# Patient Record
Sex: Female | Born: 1970 | Race: White | Hispanic: No | Smoking: Never smoker
Health system: Southern US, Community
[De-identification: ages and names within clinical notes are randomized; demographics above are authoritative.]

## PROBLEM LIST (undated history)

## (undated) DIAGNOSIS — E079 Disorder of thyroid, unspecified: Secondary | ICD-10-CM

## (undated) DIAGNOSIS — K219 Gastro-esophageal reflux disease without esophagitis: Secondary | ICD-10-CM

## (undated) HISTORY — PX: NO PAST SURGERIES: SHX2092

## (undated) HISTORY — DX: Disorder of thyroid, unspecified: E07.9

## (undated) HISTORY — DX: Gastro-esophageal reflux disease without esophagitis: K21.9

---

## 2001-09-21 ENCOUNTER — Other Ambulatory Visit: Admission: RE | Admit: 2001-09-21 | Discharge: 2001-09-21 | Payer: Self-pay | Admitting: Family Medicine

## 2005-07-21 ENCOUNTER — Ambulatory Visit: Payer: Self-pay | Admitting: Family Medicine

## 2006-09-05 IMAGING — NM NM BONE LIMITED
1 series · 4 of 4 positions shown · non-contrast
Comparison: none

REASON FOR EXAM: Scan both feet bilateral foot pain foot xray at [REDACTED] showed bi...
COMMENTS:

[Series 1: bone statics · 2.40mm/px · 2 acquisitions, 4 frames shown]
[im 1/2]
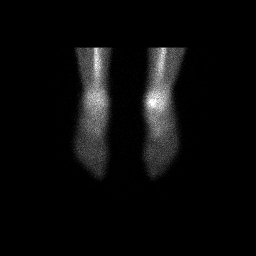
[im 1/2]
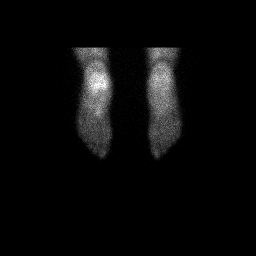
[im 2/2]
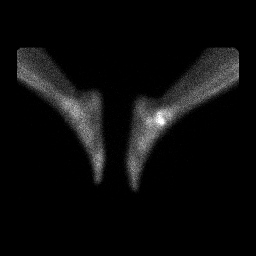
[im 2/2]
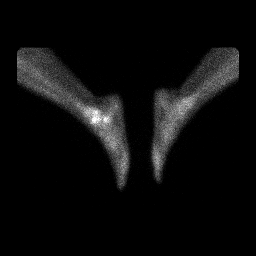

[4 of 4 positions shown; findings below may reference images not displayed]

PROCEDURE:     NM  - NM LIMITED BONE SCAN 3HR [DATE] [DATE]

RESULT:       The patient is complaining of bilateral foot discomfort.

The patient received 23.22 mCi Tc 99m labeled MDP.  Static delayed bone
imaging was performed in the usual fashion.  There is adequate soft tissue
clearance and renal activity present.

There are areas of mildly increased uptake noted involving the LEFT ankle.
A small amount of increased uptake in the anterior aspect of the calcaneus
on the LEFT is suspected as well.  On the plantar views, there is minimally
increased uptake in the region of the second cuneiform on the LEFT but this
is extremely mild.  This may, in fact, be related to the base of the second
metatarsal. Activity over the RIGHT foot appears normal.
IMPRESSION: 1.     Minimally increased uptake in the region of the second cuneiform/base
of the second metatarsal is seen on the LEFT.   Again, this is extremely low
level increased uptake.
2.     There is moderately increased uptake both along the medial and
lateral aspects of the LEFT ankle that suggest degenerative change.

## 2007-08-12 ENCOUNTER — Ambulatory Visit: Payer: Self-pay | Admitting: Gastroenterology

## 2008-02-22 ENCOUNTER — Ambulatory Visit: Payer: Self-pay | Admitting: Unknown Physician Specialty

## 2008-09-26 IMAGING — RF DG UGI W/ KUB
1 series · 15 of 19 positions shown · non-contrast
Comparison: none

REASON FOR EXAM: dysphagia
COMMENTS:

[Series 1: run · 15 of 19 slices shown]
[im 1/19]
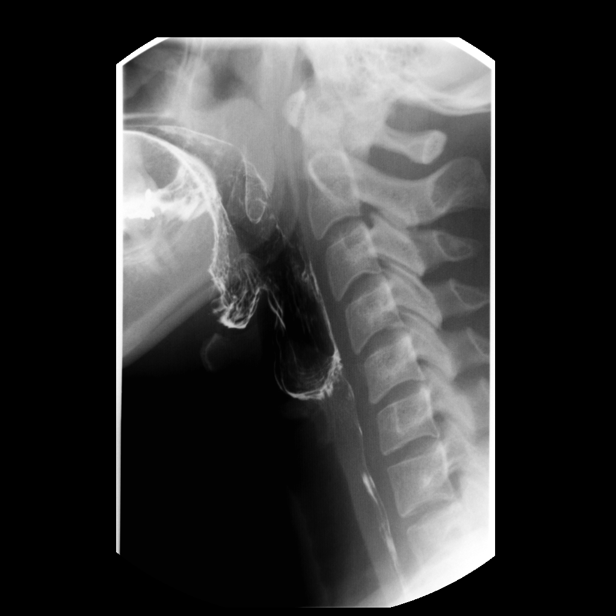
[im 2/19]
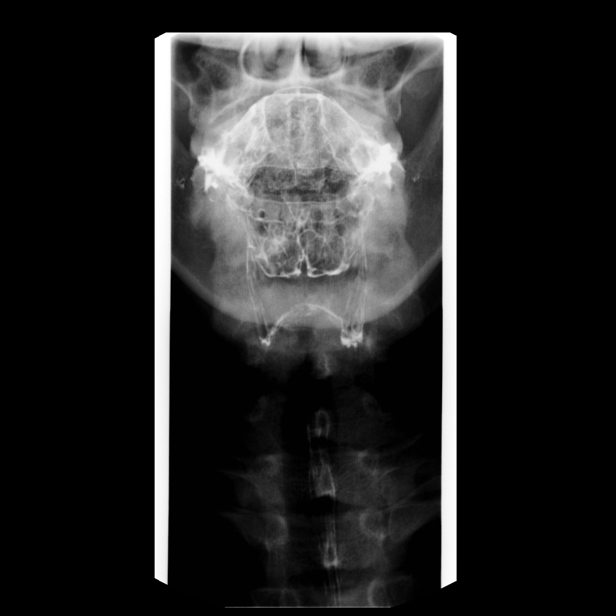
[im 4/19]
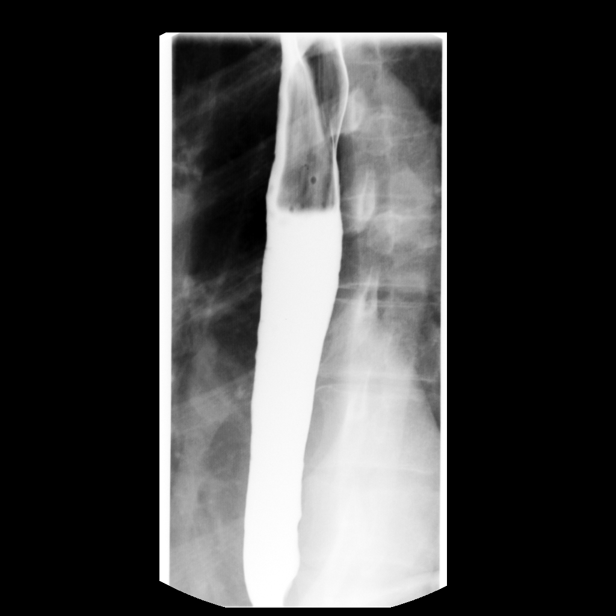
[im 5/19]
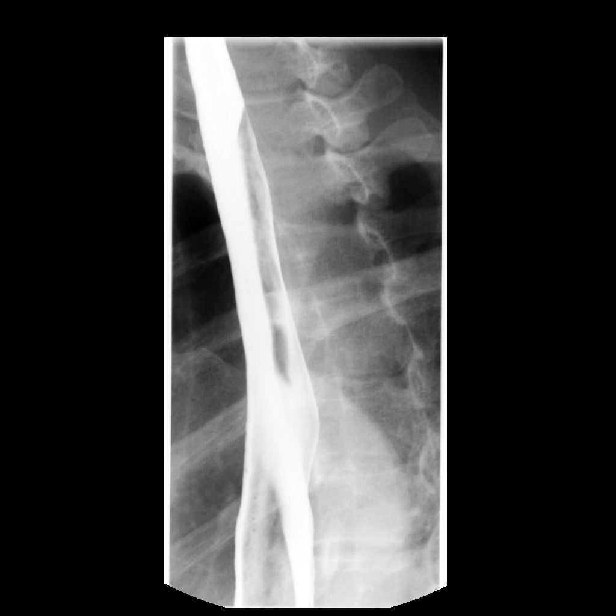
[im 6/19]
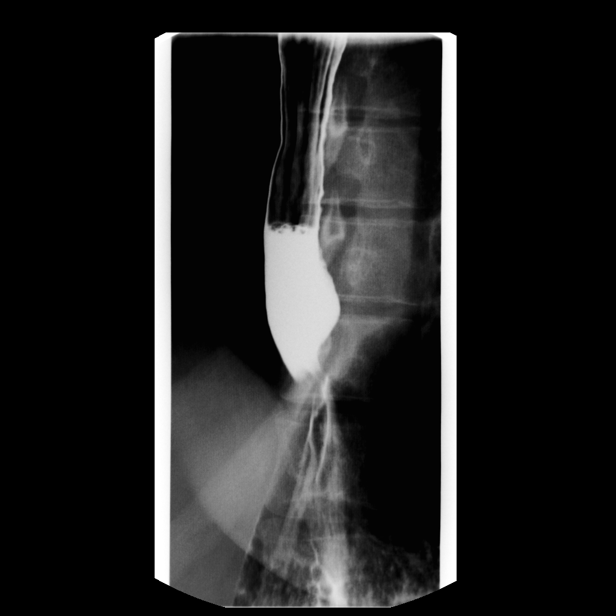
[im 7/19]
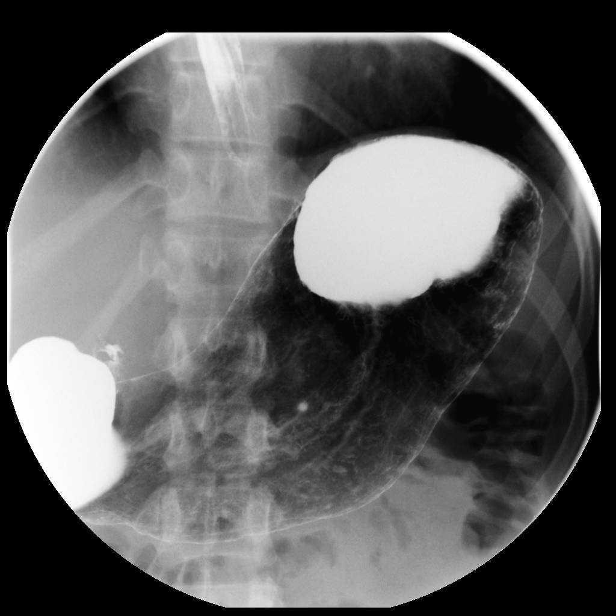
[im 9/19]
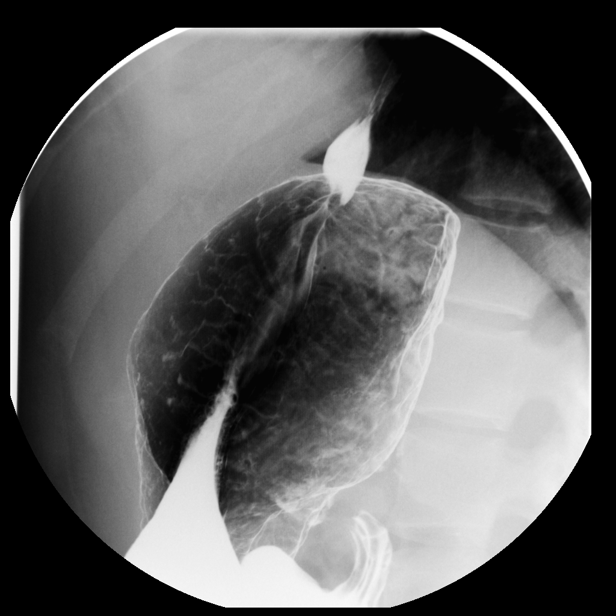
[im 10/19]
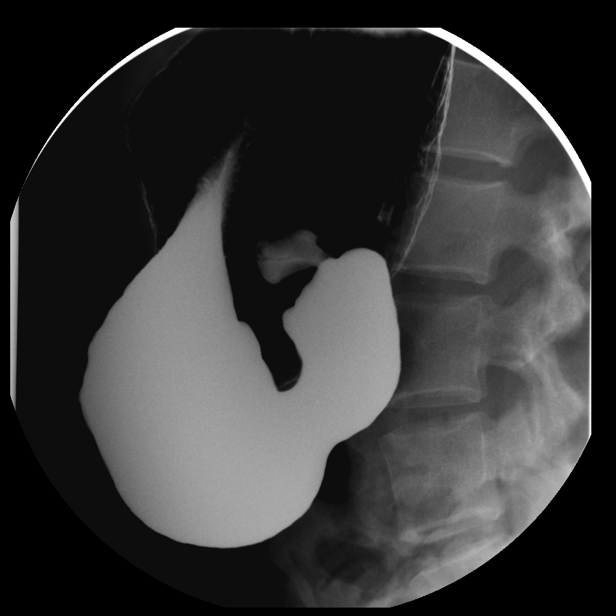
[im 11/19]
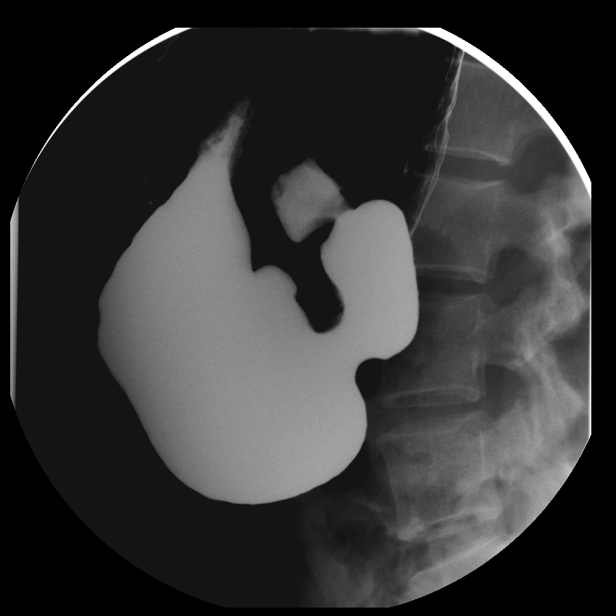
[im 13/19]
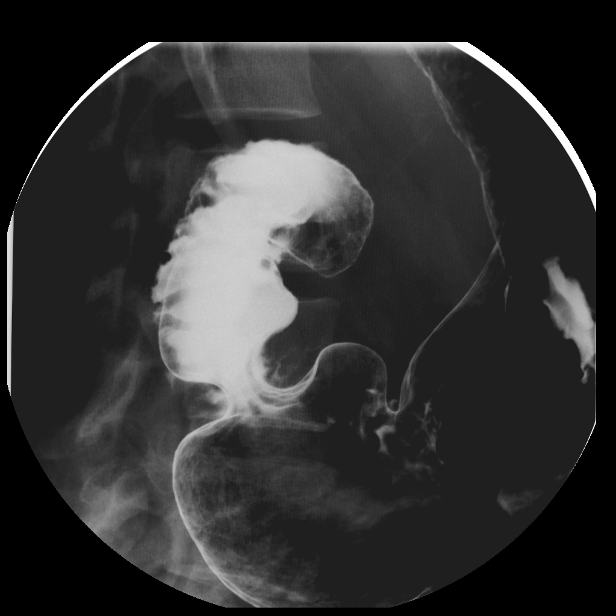
[im 14/19]
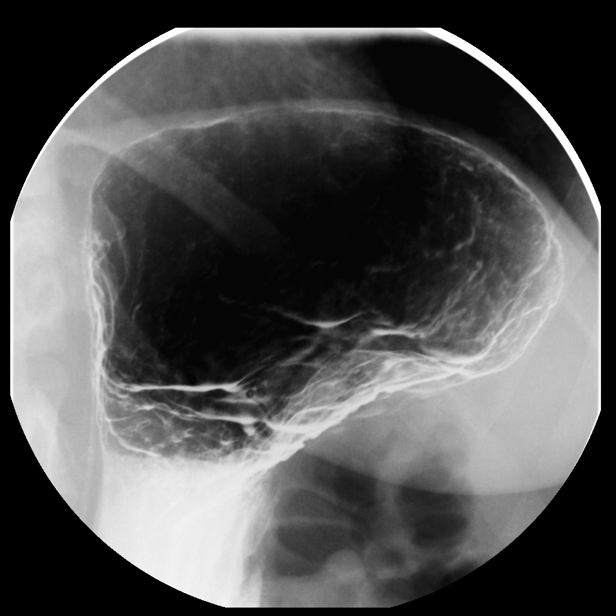
[im 15/19]
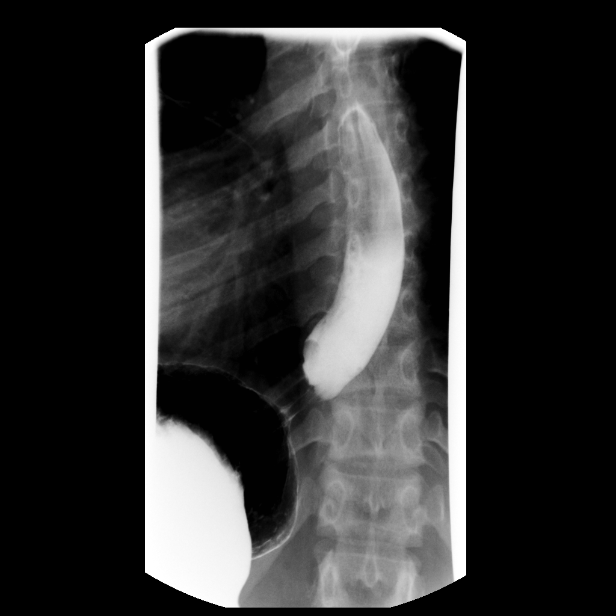
[im 16/19]
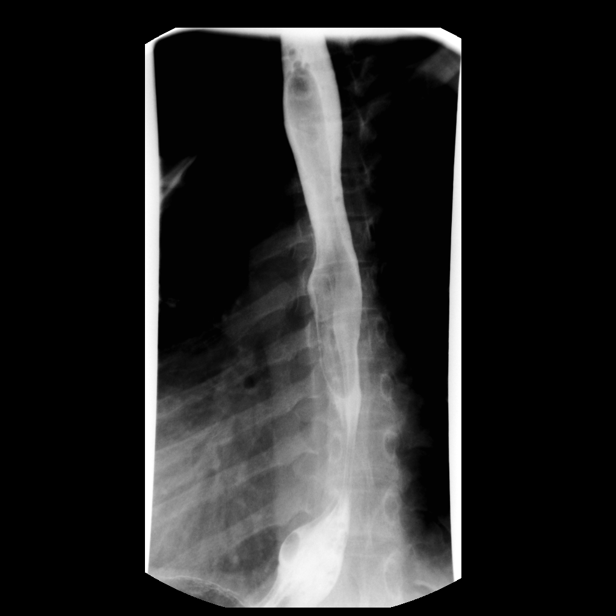
[im 18/19]
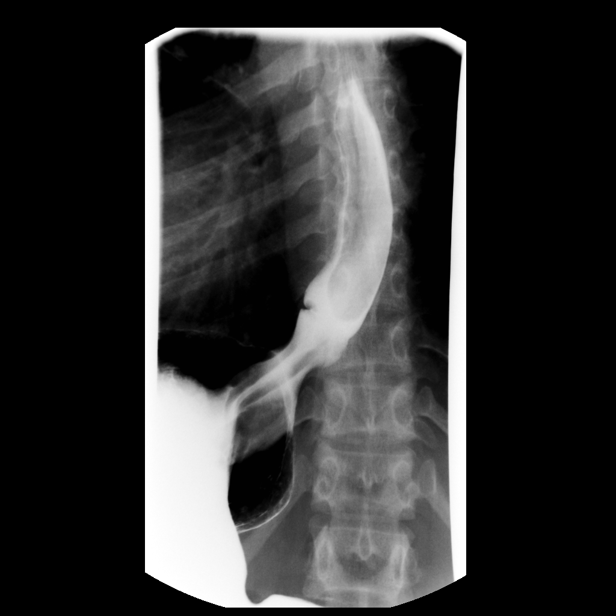
[im 19/19]
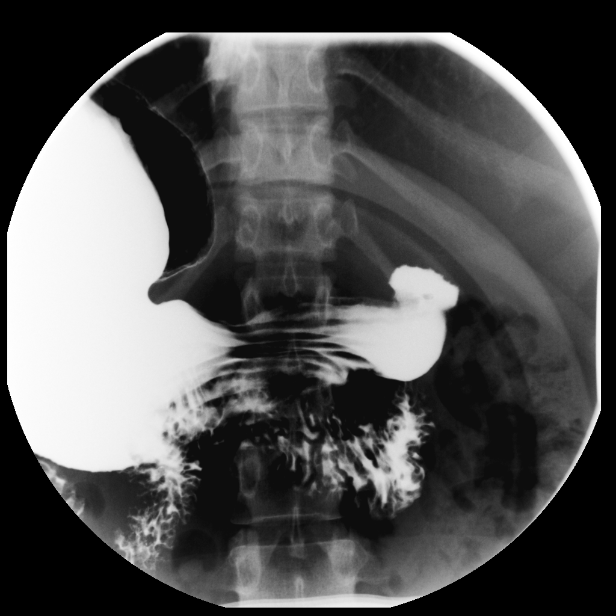

[15 of 19 positions shown; findings below may reference images not displayed]

PROCEDURE:     FL  - FL UPPER GI W/ BARIUM SWALLOW  - August 12, 2007  [DATE]

RESULT:     The anticipated procedure was discussed with Ms. Vaianos. She
voiced her willingness to proceed.

The cervical esophagus distended well. There was no laryngeal penetration of
the barium. The thoracic esophagus also distended well. There is no evidence
of esophagitis. A small amount of spontaneous gastroesophageal reflux was
demonstrated and there was a small reducible hiatal hernia. The 12 mm barium
pill passed without difficulty. The stomach and duodenum are normal in
appearance.
IMPRESSION: There is a small amount of gastroesophageal reflux with a small reducible
hiatal hernia. Otherwise the esophagus and stomach are normal in appearance.

## 2009-08-07 ENCOUNTER — Ambulatory Visit: Payer: Self-pay | Admitting: Unknown Physician Specialty

## 2009-08-28 ENCOUNTER — Ambulatory Visit: Payer: Self-pay | Admitting: Unknown Physician Specialty

## 2010-10-13 IMAGING — US ULTRASOUND LEFT BREAST
1 series · 18 of 25 positions shown · non-contrast
Comparison: none

REASON FOR EXAM: left breast nodular density
COMMENTS:

PROCEDURE:     US  - US BREAST LEFT  - August 28, 2009 [DATE]
RESULT:     A tiny 2-mm cyst is noted in the inferior aspect of the left
breast corresponding to mammographic abnormality. No further evaluation is
needed. Routine yearly follow-up mammogram can be obtained.

[Series 1: ultrasound left breast · 18 of 27 slices shown]
[im 1/27]
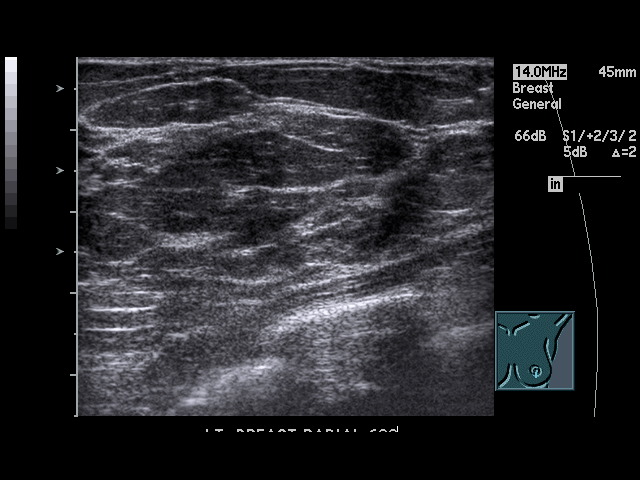
[im 3/27]
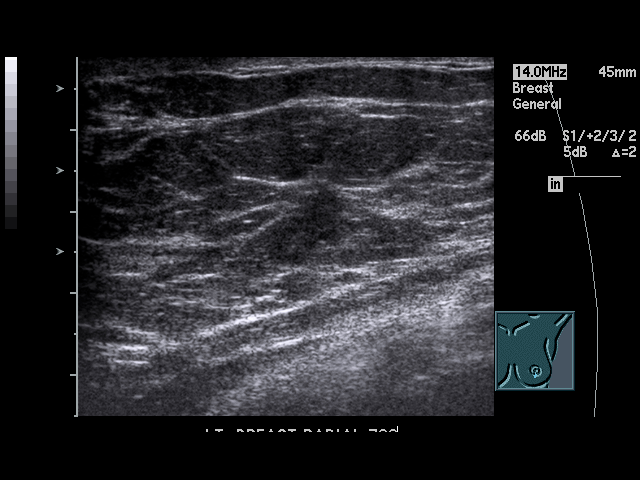
[im 4/27]
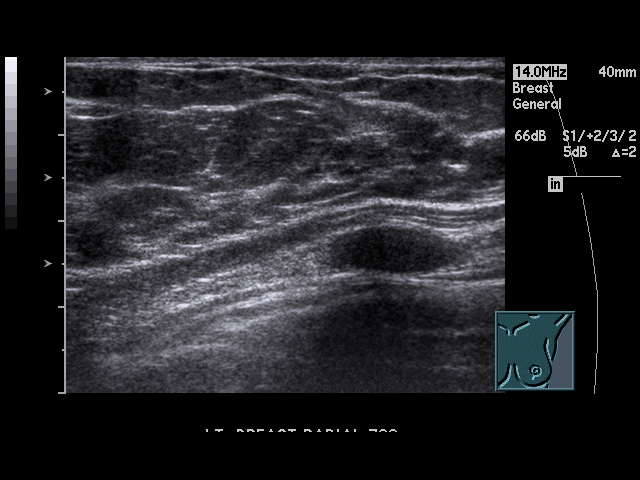
[im 5/27]
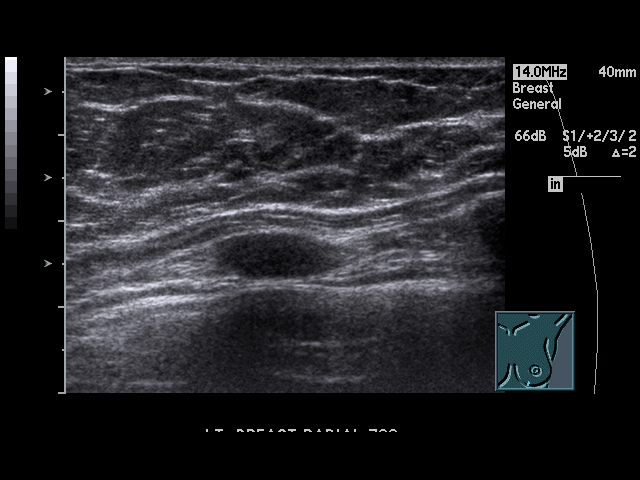
[im 7/27]
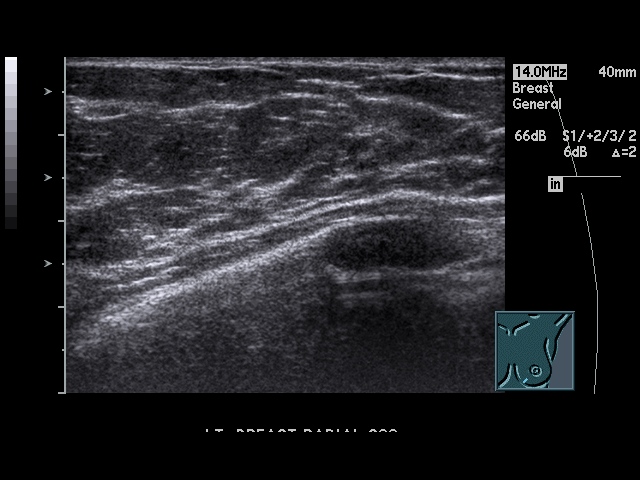
[im 8/27]
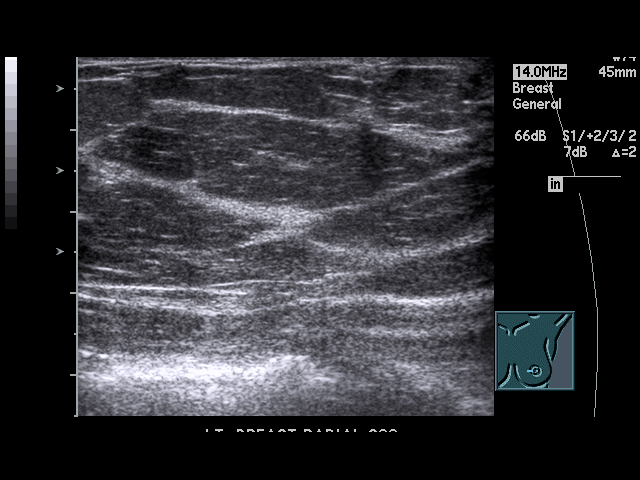
[im 10/27]
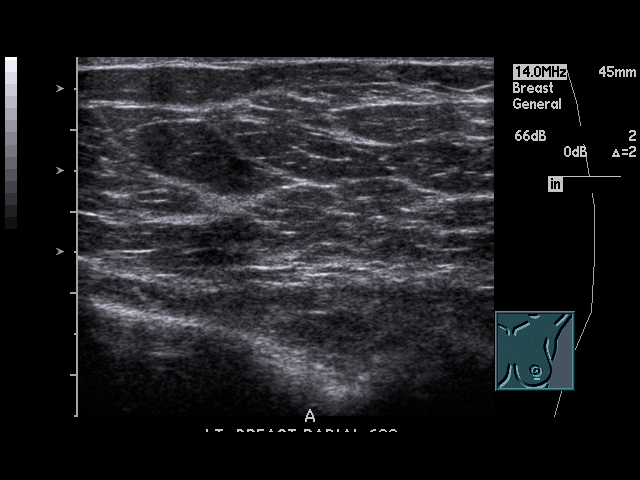
[im 11/27]
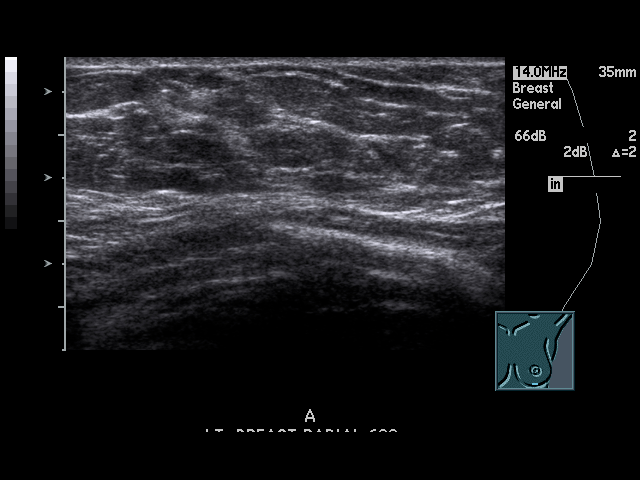
[im 12/27]
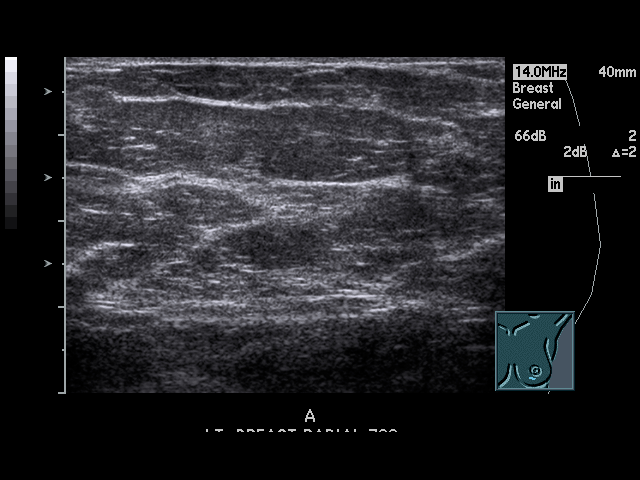
[im 15/27]
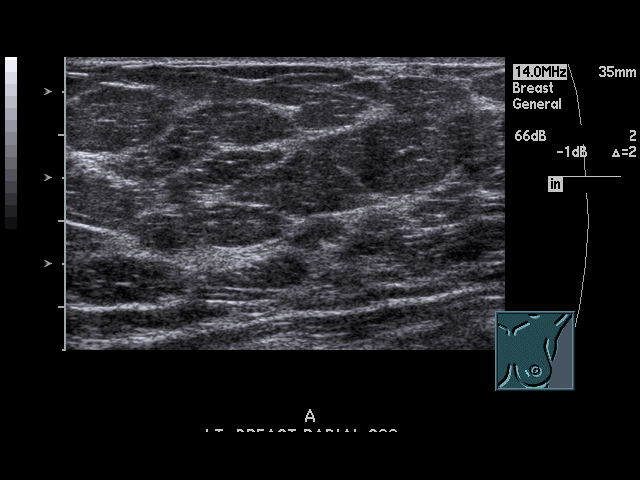
[im 16/27]
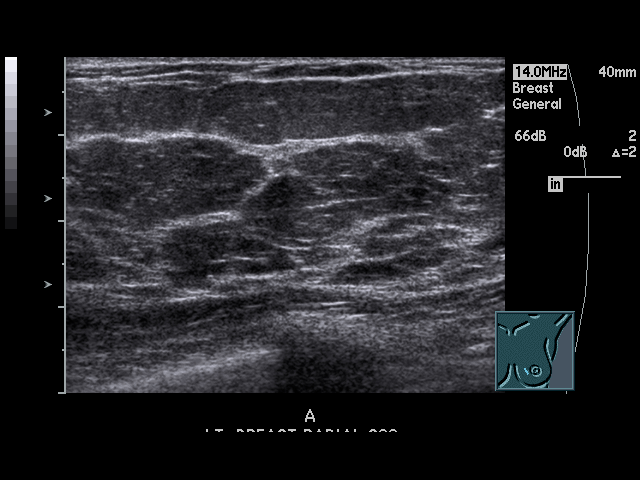
[im 17/27]
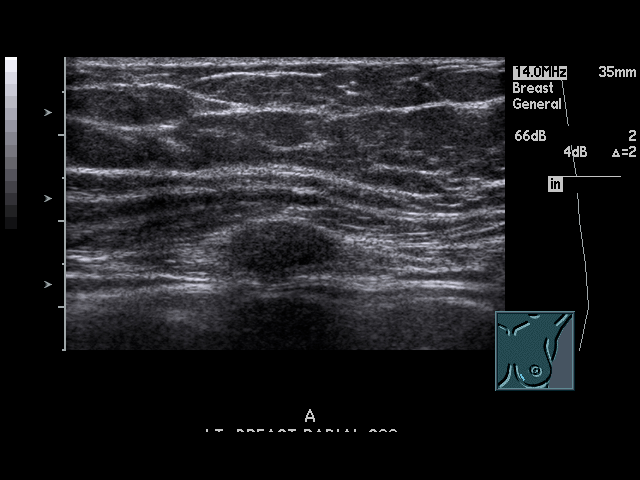
[im 19/27]
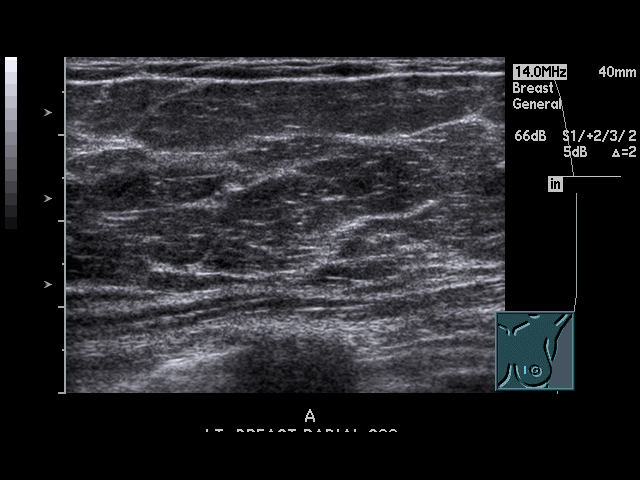
[im 20/27]
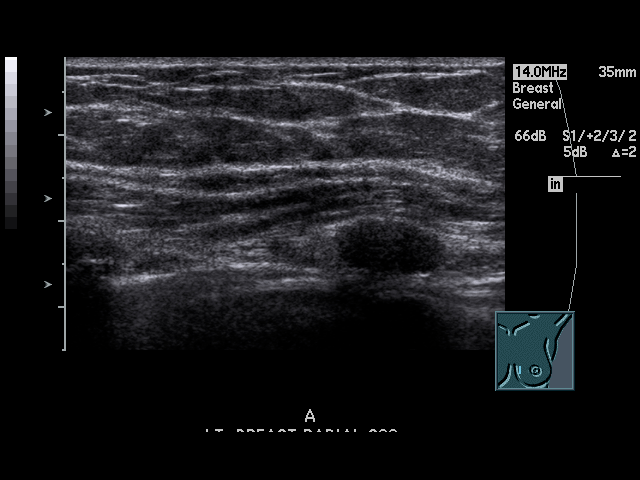
[im 22/27]
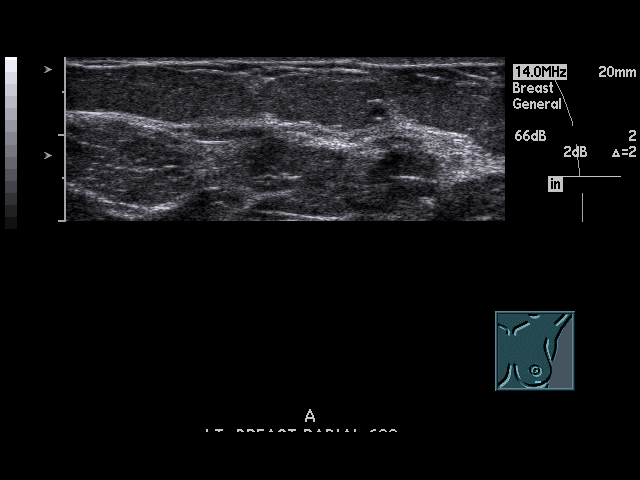
[im 23/27]
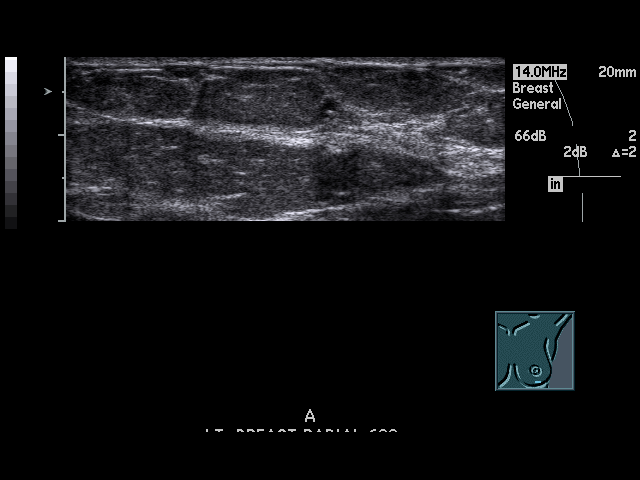
[im 24/27]
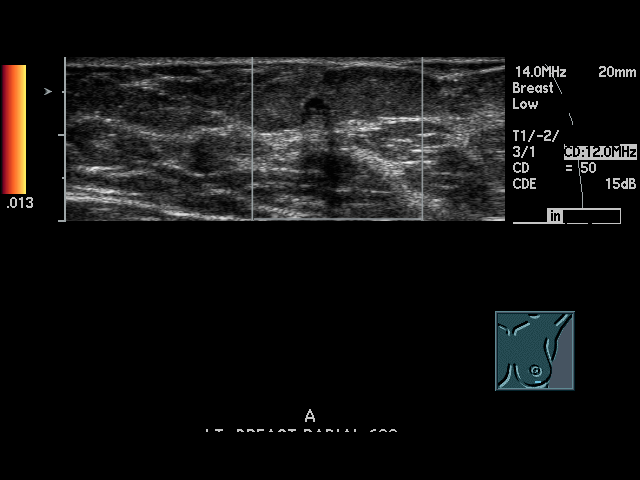
[im 27/27]
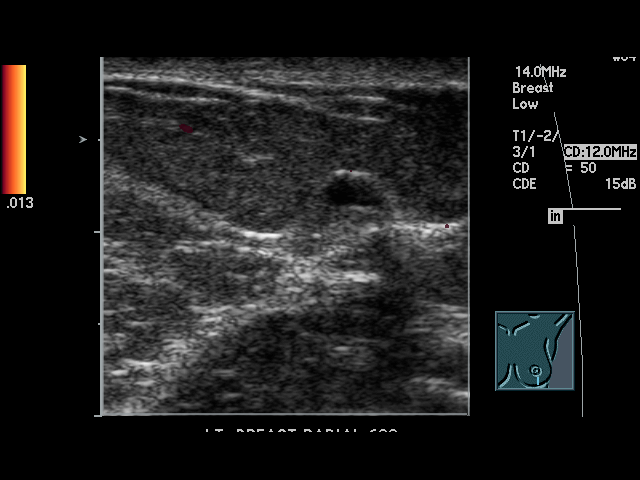

[18 of 25 positions shown; findings below may reference images not displayed]

IMPRESSION: BI-RADS: Category 2 - Benign Findings

## 2015-10-30 ENCOUNTER — Other Ambulatory Visit: Payer: Self-pay | Admitting: Physician Assistant

## 2015-11-01 ENCOUNTER — Other Ambulatory Visit: Payer: Self-pay | Admitting: Emergency Medicine

## 2015-11-01 MED ORDER — OMEPRAZOLE 20 MG PO CPDR: 20.0000 mg | DELAYED_RELEASE_CAPSULE | Freq: Every day | ORAL | Status: DC

## 2015-11-01 NOTE — Telephone Encounter (Signed)
Med refill approved 

## 2015-11-01 NOTE — Telephone Encounter (Signed)
Received a faxed medication request from [facility] [address]  Please advise.  Thank you.

## 2016-04-08 ENCOUNTER — Encounter: Payer: Self-pay | Admitting: Physician Assistant

## 2016-04-08 ENCOUNTER — Ambulatory Visit: Payer: Self-pay | Admitting: Physician Assistant

## 2016-04-08 VITALS — BP 120/70 | HR 79 | Temp 98.7°F

## 2016-04-08 DIAGNOSIS — B353 Tinea pedis: Secondary | ICD-10-CM

## 2016-04-08 DIAGNOSIS — B351 Tinea unguium: Secondary | ICD-10-CM

## 2016-04-08 MED ORDER — TERBINAFINE HCL 250 MG PO TABS: 250.0000 mg | ORAL_TABLET | Freq: Every day | ORAL | Status: DC

## 2016-04-08 NOTE — Progress Notes (Signed)
S: c/o foot pain and rash on toes, on going problems with athletes foot, tinactin has stopped working, area will get too dry and crack, then looks infected and will clear up, ? If can't feel her feet really well because didn't realize her feet were soaked with water while out on a call  O: vitals wnl, nad, skin on feet dry scaly flakey and cracked, great toenail discolored by yellow tint on 1/3 of nail, n/v intact  A: foot pain, tinea pedis, onychomycosis of toenail

## 2016-04-09 LAB — COMPREHENSIVE METABOLIC PANEL
A/G RATIO: 1.7 (ref 1.2–2.2)
ALT: 33 IU/L — AB (ref 0–32)
AST: 25 IU/L (ref 0–40)
Albumin: 4.7 g/dL (ref 3.5–5.5)
Alkaline Phosphatase: 50 IU/L (ref 39–117)
BILIRUBIN TOTAL: 0.2 mg/dL (ref 0.0–1.2)
BUN/Creatinine Ratio: 18 (ref 9–23)
BUN: 14 mg/dL (ref 6–24)
CALCIUM: 9.9 mg/dL (ref 8.7–10.2)
CHLORIDE: 98 mmol/L (ref 96–106)
CO2: 21 mmol/L (ref 18–29)
Creatinine, Ser: 0.77 mg/dL (ref 0.57–1.00)
GFR calc Af Amer: 109 mL/min/{1.73_m2} (ref >59)
GFR calc non Af Amer: 95 mL/min/{1.73_m2} (ref >59)
GLUCOSE: 110 mg/dL — AB (ref 65–99)
Globulin, Total: 2.7 g/dL (ref 1.5–4.5)
POTASSIUM: 4.1 mmol/L (ref 3.5–5.2)
Sodium: 138 mmol/L (ref 134–144)
Total Protein: 7.4 g/dL (ref 6.0–8.5)

## 2016-05-05 ENCOUNTER — Telehealth: Payer: Self-pay | Admitting: Family Medicine

## 2016-05-05 NOTE — Telephone Encounter (Signed)
Please arrange and needed.  Thanks ED

## 2016-05-05 NOTE — Telephone Encounter (Signed)
ok 

## 2016-05-05 NOTE — Telephone Encounter (Signed)
Please review-aa 

## 2016-05-05 NOTE — Telephone Encounter (Signed)
Pt would like to re-establish with Dr. Sullivan LoneGilbert. Pt was last seen 02/07/11. Pt stated that she hasn't been to another PCP just going to Urgent Cares as needed.  Insurance: [employer] Current Medications: Omeprazole Pt stated no new medical conditions but would like to discuss elevated BP. Pt stated that she doesn't mind waiting to get an appt but she would really like to re-establish care with Dr. Sullivan LoneGilbert. Please advise. Thanks TNP

## 2016-05-05 NOTE — Telephone Encounter (Signed)
Scheduled pt for Wednesday 05/07/16 @ 10:30. Thanks TNP

## 2016-05-07 ENCOUNTER — Ambulatory Visit (INDEPENDENT_AMBULATORY_CARE_PROVIDER_SITE_OTHER): Payer: Managed Care, Other (non HMO) | Admitting: Family Medicine

## 2016-05-07 ENCOUNTER — Encounter: Payer: Self-pay | Admitting: Family Medicine

## 2016-05-07 VITALS — BP 110/80 | HR 64 | Temp 97.9°F | Resp 16 | Ht 64.0 in | Wt 179.0 lb

## 2016-05-07 DIAGNOSIS — R0789 Other chest pain: Secondary | ICD-10-CM

## 2016-05-07 DIAGNOSIS — R030 Elevated blood-pressure reading, without diagnosis of hypertension: Secondary | ICD-10-CM

## 2016-05-07 DIAGNOSIS — R5383 Other fatigue: Secondary | ICD-10-CM

## 2016-05-07 DIAGNOSIS — E785 Hyperlipidemia, unspecified: Secondary | ICD-10-CM

## 2016-05-07 DIAGNOSIS — Z7189 Other specified counseling: Secondary | ICD-10-CM | POA: Diagnosis not present

## 2016-05-07 DIAGNOSIS — IMO0001 Reserved for inherently not codable concepts without codable children: Secondary | ICD-10-CM

## 2016-05-07 DIAGNOSIS — K219 Gastro-esophageal reflux disease without esophagitis: Secondary | ICD-10-CM

## 2016-05-07 DIAGNOSIS — Z7689 Persons encountering health services in other specified circumstances: Secondary | ICD-10-CM

## 2016-05-07 MED ORDER — PANTOPRAZOLE SODIUM 40 MG PO TBEC: 40.0000 mg | DELAYED_RELEASE_TABLET | Freq: Every day | ORAL | Status: DC

## 2016-05-07 NOTE — Progress Notes (Signed)
Patient ID: Eileen Hooper, female   DOB: 10/31/1971, 45 y.o.   MRN: [id number]    Subjective:  HPI Pt is re-establishing care today. [facility]. She has been having increased GERD. She one episode where she had been drinking the night before (memorial day weekend) she got out of bed feeling very tired and run down. She got a different kind of coffee from a different coffee shop with an extra shot of caffeine. She late started feeling increased chest tightness, headache and shortness of breath. Her supervisor took her to EMS station and she was examined, they did a EKG and her BP was 160/100. She was told to follow up with her PCP. She reports that she has burping and reflux accompanied with chest tightness in the center of the chest. She reports that she can not get comfortable at night. She says its severe and she can not eat anything without having this. She has been taking Omeprazole 20 mg for about 3 years. She was having these type of symptoms when she was first prescribed Omeprazole.   Prior to Admission medications   Medication Sig Start Date End Date Taking? Authorizing Provider  omeprazole (PRILOSEC) 20 MG capsule Take 1 capsule (20 mg total) by mouth daily. 11/01/15  Yes Faythe GheeSusan W Fisher, PA-C  terbinafine (LAMISIL) 250 MG tablet Take 1 tablet (250 mg total) by mouth daily. 04/08/16  Yes Faythe GheeSusan W Fisher, PA-C    Patient Active Problem List   Diagnosis Date Noted  . GERD (gastroesophageal reflux disease) 05/07/2016    History reviewed. No pertinent past medical history.  Social History   Social History  . Marital Status: Married    Spouse Name: N/A  . Number of Children: N/A  . Years of Education: N/A   Occupational History  . Not on file.   Social History Main Topics  . Smoking status: Never Smoker   . Smokeless tobacco: Not on file  . Alcohol Use: 0.0 oz/week    0 Standard drinks or equivalent per week     Comment: socially  . Drug  Use: No  . Sexual Activity: Not on file   Other Topics Concern  . Not on file   Social History Narrative    No Known Allergies  Review of Systems  Constitutional: Positive for malaise/fatigue.  Eyes: Negative.   Respiratory: Positive for shortness of breath.   Cardiovascular: Positive for chest pain.  Gastrointestinal: Positive for heartburn.  Genitourinary: Negative.   Musculoskeletal: Negative.   Skin: Negative.   Neurological: Negative.   Endo/Heme/Allergies: Negative.   Psychiatric/Behavioral: The patient is nervous/anxious.        Agitation and irritability     There is no immunization history on file for this patient. Objective:  BP 110/80 mmHg  Pulse 64  Temp(Src) 97.9 F (36.6 C) (Oral)  Resp 16  Ht 5\' 4"  (1.626 m)  Wt 179 lb (81.194 kg)  BMI 30.71 kg/m2  Physical Exam  Constitutional: She is oriented to person, place, and time and well-developed, well-nourished, and in no distress.  HENT:  Head: Normocephalic and atraumatic.  Right Ear: External ear normal.  Left Ear: External ear normal.  Nose: Nose normal.  Eyes: Conjunctivae and EOM are normal. Pupils are equal, round, and reactive to light.  Neck: Normal range of motion. Neck supple.  Cardiovascular: Normal rate, regular rhythm, normal heart sounds and intact distal pulses.   Pulmonary/Chest: Effort normal and breath sounds  normal.  Abdominal: Soft. Bowel sounds are normal.  Musculoskeletal: Normal range of motion.  Neurological: She is alert and oriented to person, place, and time. She has normal reflexes. Gait normal. GCS score is 15.  Skin: Skin is warm and dry.  Psychiatric: Mood, memory, affect and judgment normal.    Lab Results  Component Value Date   GLUCOSE 110* 04/08/2016    CMP     Component Value Date/Time   NA 138 04/08/2016 1619   K 4.1 04/08/2016 1619   CL 98 04/08/2016 1619   CO2 21 04/08/2016 1619   GLUCOSE 110* 04/08/2016 1619   BUN 14 04/08/2016 1619   CREATININE  0.77 04/08/2016 1619   CALCIUM 9.9 04/08/2016 1619   PROT 7.4 04/08/2016 1619   ALBUMIN 4.7 04/08/2016 1619   AST 25 04/08/2016 1619   ALT 33* 04/08/2016 1619   ALKPHOS 50 04/08/2016 1619   BILITOT 0.2 04/08/2016 1619   GFRNONAA 95 04/08/2016 1619   GFRAA 109 04/08/2016 1619    Assessment and Plan :  1. Gastroesophageal reflux disease, esophagitis presence not specified RTC 1 month. - Comprehensive metabolic panel - pantoprazole (PROTONIX) 40 MG tablet; Take 1 tablet (40 mg total) by mouth daily.  Dispense: 60 tablet; Refill: 12  2. Encounter to establish care   3. Other fatigue  - CBC with Differential/Platelet - TSH  4. Chest tightness ECG from work is unremarkable. Deconditioning. 5. Hyperlipemia  - Lipid Panel With LDL/HDL Ratio  6. Elevated BP 7. Weight gain Patient says she has gained 50 pounds since 2011 with overeating and no exercise.  Patient was seen and examined by Dr. Julieanne Manson, and noted scribed by Dimas Chyle, CMA  Julieanne Manson MD [facility] 05/07/2016 10:50 AM

## 2016-05-13 LAB — CBC WITH DIFFERENTIAL/PLATELET
BASOS: 0 %
Basophils Absolute: 0 10*3/uL (ref 0.0–0.2)
EOS (ABSOLUTE): 0.2 10*3/uL (ref 0.0–0.4)
Eos: 3 %
HEMATOCRIT: 41.6 % (ref 34.0–46.6)
HEMOGLOBIN: 13.6 g/dL (ref 11.1–15.9)
IMMATURE GRANS (ABS): 0 10*3/uL (ref 0.0–0.1)
Immature Granulocytes: 0 %
LYMPHS: 30 %
Lymphocytes Absolute: 2.1 10*3/uL (ref 0.7–3.1)
MCH: 28.4 pg (ref 26.6–33.0)
MCHC: 32.7 g/dL (ref 31.5–35.7)
MCV: 87 fL (ref 79–97)
MONOCYTES: 6 %
Monocytes Absolute: 0.4 10*3/uL (ref 0.1–0.9)
NEUTROS ABS: 4.3 10*3/uL (ref 1.4–7.0)
Neutrophils: 61 %
Platelets: 258 10*3/uL (ref 150–379)
RBC: 4.79 x10E6/uL (ref 3.77–5.28)
RDW: 14.6 % (ref 12.3–15.4)
WBC: 7.1 10*3/uL (ref 3.4–10.8)

## 2016-05-13 LAB — COMPREHENSIVE METABOLIC PANEL
A/G RATIO: 1.4 (ref 1.2–2.2)
ALBUMIN: 4.4 g/dL (ref 3.5–5.5)
ALT: 36 IU/L — ABNORMAL HIGH (ref 0–32)
AST: 23 IU/L (ref 0–40)
Alkaline Phosphatase: 55 IU/L (ref 39–117)
BILIRUBIN TOTAL: 0.4 mg/dL (ref 0.0–1.2)
BUN / CREAT RATIO: 12 (ref 9–23)
BUN: 10 mg/dL (ref 6–24)
CHLORIDE: 100 mmol/L (ref 96–106)
CO2: 19 mmol/L (ref 18–29)
Calcium: 9.4 mg/dL (ref 8.7–10.2)
Creatinine, Ser: 0.84 mg/dL (ref 0.57–1.00)
GFR, EST AFRICAN AMERICAN: 98 mL/min/{1.73_m2} (ref >59)
GFR, EST NON AFRICAN AMERICAN: 85 mL/min/{1.73_m2} (ref >59)
Globulin, Total: 3.1 g/dL (ref 1.5–4.5)
Glucose: 108 mg/dL — ABNORMAL HIGH (ref 65–99)
POTASSIUM: 4.7 mmol/L (ref 3.5–5.2)
Sodium: 137 mmol/L (ref 134–144)
TOTAL PROTEIN: 7.5 g/dL (ref 6.0–8.5)

## 2016-05-13 LAB — LIPID PANEL WITH LDL/HDL RATIO
Cholesterol, Total: 195 mg/dL (ref 100–199)
HDL: 45 mg/dL (ref >39)
LDL CALC: 133 mg/dL — AB (ref 0–99)
LDL/HDL RATIO: 3 ratio (ref 0.0–3.2)
Triglycerides: 87 mg/dL (ref 0–149)
VLDL Cholesterol Cal: 17 mg/dL (ref 5–40)

## 2016-05-13 LAB — TSH: TSH: 6.8 u[IU]/mL — ABNORMAL HIGH (ref 0.450–4.500)

## 2016-05-14 ENCOUNTER — Other Ambulatory Visit: Payer: Self-pay

## 2016-05-14 DIAGNOSIS — E038 Other specified hypothyroidism: Secondary | ICD-10-CM

## 2016-05-14 DIAGNOSIS — E039 Hypothyroidism, unspecified: Secondary | ICD-10-CM | POA: Insufficient documentation

## 2016-05-14 DIAGNOSIS — E669 Obesity, unspecified: Secondary | ICD-10-CM | POA: Insufficient documentation

## 2016-05-14 MED ORDER — LEVOTHYROXINE SODIUM 50 MCG PO TABS: 50.0000 ug | ORAL_TABLET | Freq: Every day | ORAL | Status: DC

## 2016-06-09 ENCOUNTER — Encounter: Payer: Self-pay | Admitting: Family Medicine

## 2016-06-09 ENCOUNTER — Ambulatory Visit (INDEPENDENT_AMBULATORY_CARE_PROVIDER_SITE_OTHER): Payer: Managed Care, Other (non HMO) | Admitting: Family Medicine

## 2016-06-09 VITALS — BP 112/76 | HR 70 | Temp 97.6°F | Resp 16 | Wt 180.0 lb

## 2016-06-09 DIAGNOSIS — K219 Gastro-esophageal reflux disease without esophagitis: Secondary | ICD-10-CM | POA: Diagnosis not present

## 2016-06-09 DIAGNOSIS — E038 Other specified hypothyroidism: Secondary | ICD-10-CM

## 2016-06-09 NOTE — Progress Notes (Signed)
Patient ID: Eileen Hooper, female   DOB: 01/13/1971, 45 y.o.   MRN: [id number]    Subjective:  HPI GERD- Pt is here for a 1 month follow up for GERD. She was started on Pantoprazole at last OV. She reports that she feels much better. She is only taking it once a day instead of twice because she has felt so good. Labs were drawn at last OV and showed her to have hypothyroidism. She has started Synthroid and she reports that she feels great. Notes on the lab results say to recheck TSH in 3-4 months.   Prior to Admission medications   Medication Sig Start Date End Date Taking? Authorizing Provider  levothyroxine (SYNTHROID, LEVOTHROID) 50 MCG tablet Take 1 tablet (50 mcg total) by mouth daily. 05/14/16  Yes Princessa Lesmeister Hulen ShoutsL Ormand Senn Jr., MD  pantoprazole (PROTONIX) 40 MG tablet Take 1 tablet (40 mg total) by mouth daily. 05/07/16  Yes Modestine Scherzinger Hulen ShoutsL Phyllicia Dudek Jr., MD    Patient Active Problem List   Diagnosis Date Noted  . Hypothyroidism 05/14/2016  . Adiposity 05/14/2016  . GERD (gastroesophageal reflux disease) 05/07/2016    History reviewed. No pertinent past medical history.  Social History   Social History  . Marital Status: Married    Spouse Name: N/A  . Number of Children: N/A  . Years of Education: N/A   Occupational History  . Not on file.   Social History Main Topics  . Smoking status: Never Smoker   . Smokeless tobacco: Not on file  . Alcohol Use: 0.0 oz/week    0 Standard drinks or equivalent per week     Comment: socially  . Drug Use: No  . Sexual Activity: Not on file   Other Topics Concern  . Not on file   Social History Narrative    No Known Allergies  Review of Systems  Constitutional: Negative.   HENT: Negative.   Eyes: Negative.   Respiratory: Negative.   Cardiovascular: Negative.   Gastrointestinal: Negative.   Genitourinary: Negative.   Musculoskeletal: Negative.   Skin: Negative.   Neurological: Negative.   Endo/Heme/Allergies: Negative.     Psychiatric/Behavioral: Negative.      There is no immunization history on file for this patient. Objective:  BP 112/76 mmHg  Pulse 70  Temp(Src) 97.6 F (36.4 C) (Oral)  Resp 16  Wt 180 lb (81.647 kg)  Physical Exam  Constitutional: She is oriented to person, place, and time and well-developed, well-nourished, and in no distress.  Eyes: Conjunctivae and EOM are normal. Pupils are equal, round, and reactive to light.  Neck: Normal range of motion. Neck supple.  Cardiovascular: Normal rate, regular rhythm, normal heart sounds and intact distal pulses.   Pulmonary/Chest: Effort normal and breath sounds normal.  Abdominal: Soft. Bowel sounds are normal.  Musculoskeletal: Normal range of motion.  Neurological: She is alert and oriented to person, place, and time. She has normal reflexes. Gait normal. GCS score is 15.  Skin: Skin is warm and dry.  Psychiatric: Mood, memory, affect and judgment normal.    Lab Results  Component Value Date   WBC 7.1 05/12/2016   HCT 41.6 05/12/2016   PLT 258 05/12/2016   GLUCOSE 108* 05/12/2016   CHOL 195 05/12/2016   TRIG 87 05/12/2016   HDL 45 05/12/2016   LDLCALC 133* 05/12/2016   TSH 6.800* 05/12/2016    CMP     Component Value Date/Time   NA 137 05/12/2016 0816   K 4.7 05/12/2016 0816  CL 100 05/12/2016 0816   CO2 19 05/12/2016 0816   GLUCOSE 108* 05/12/2016 0816   BUN 10 05/12/2016 0816   CREATININE 0.84 05/12/2016 0816   CALCIUM 9.4 05/12/2016 0816   PROT 7.5 05/12/2016 0816   ALBUMIN 4.4 05/12/2016 0816   AST 23 05/12/2016 0816   ALT 36* 05/12/2016 0816   ALKPHOS 55 05/12/2016 0816   BILITOT 0.4 05/12/2016 0816   GFRNONAA 85 05/12/2016 0816   GFRAA 98 05/12/2016 0816    Assessment and Plan :  1. Gastroesophageal reflux disease, esophagitis presence not specified Much improved. Continue Pantoprazole once daily. At next visit may try to wean off pantoprazole.   2. Other specified hypothyroidism Pt feeling much  better. Check labs at next OV.  3. Mild obesity Diet and exercise habits discussed.  Patient was seen and examined by Dr. Julieanne Manson, and noted scribed by Dimas Chyle, CMA  Julieanne Manson MD [facility] 06/09/2016 11:29 AM

## 2016-08-18 ENCOUNTER — Ambulatory Visit (INDEPENDENT_AMBULATORY_CARE_PROVIDER_SITE_OTHER): Payer: Managed Care, Other (non HMO) | Admitting: Family Medicine

## 2016-08-18 DIAGNOSIS — K219 Gastro-esophageal reflux disease without esophagitis: Secondary | ICD-10-CM

## 2016-08-18 DIAGNOSIS — E038 Other specified hypothyroidism: Secondary | ICD-10-CM | POA: Diagnosis not present

## 2016-08-18 NOTE — Progress Notes (Signed)
Subjective:  HPI Hypothyroidism: Patient presents for evaluation of thyroid function. Symptoms consist of denies fatigue, weight changes, heat/cold intolerance, bowel/skin changes or CVS symptoms. The problem has been unchanged.  Previous thyroid studies include TSH. Pt was seen 23-Jun-2016 for her GERD and thyroid. Last labs were done on 05/12/16 and her TSH was elevated and to recheck in 3-4 months. Pt denies any side effects however she has noticed that she feels like the thyriod medication has changed her taste and made her not want any sweets. Energy level has improved and pt is doing well. She is due to recheck her TSH today.   GERD: Pt reports that she is still taking pantoprazole once daily. She tried to stop it completely but she could not tolerate not taking.    Prior to Admission medications   Medication Sig Start Date End Date Taking? Authorizing Provider  levothyroxine (SYNTHROID, LEVOTHROID) 50 MCG tablet Take 1 tablet (50 mcg total) by mouth daily. 05/14/16   Richard Hulen Shouts., MD  pantoprazole (PROTONIX) 40 MG tablet Take 1 tablet (40 mg total) by mouth daily. 05/07/16   Richard Hulen Shouts., MD    Patient Active Problem List   Diagnosis Date Noted  . Hypothyroidism 05/14/2016  . Adiposity 05/14/2016  . GERD (gastroesophageal reflux disease) 05/07/2016    No past medical history on file.  Social History   Social History  . Marital status: Married    Spouse name: N/A  . Number of children: N/A  . Years of education: N/A   Occupational History  . Not on file.   Social History Main Topics  . Smoking status: Never Smoker  . Smokeless tobacco: Not on file  . Alcohol use 0.0 oz/week     Comment: socially  . Drug use: No  . Sexual activity: Not on file   Other Topics Concern  . Not on file   Social History Narrative  . No narrative on file    No Known Allergies  Review of Systems  Constitutional: Negative.   HENT: Negative.   Eyes: Negative.     Respiratory: Negative.   Cardiovascular: Negative.   Gastrointestinal: Negative.   Genitourinary: Negative.   Musculoskeletal: Negative.   Skin: Negative.   Neurological: Negative.   Endo/Heme/Allergies: Negative.   Psychiatric/Behavioral: Negative.      There is no immunization history on file for this patient. Objective:  There were no vitals taken for this visit.  Physical Exam  Constitutional: She is oriented to person, place, and time and well-developed, well-nourished, and in no distress.  HENT:  Head: Normocephalic and atraumatic.  Right Ear: External ear normal.  Left Ear: External ear normal.  Nose: Nose normal.  Eyes: Conjunctivae and EOM are normal. Pupils are equal, round, and reactive to light.  Neck: Normal range of motion. Neck supple.  Cardiovascular: Normal rate, regular rhythm, normal heart sounds and intact distal pulses.   Pulmonary/Chest: Effort normal and breath sounds normal.  Musculoskeletal: Normal range of motion.  Neurological: She is alert and oriented to person, place, and time. She has normal reflexes. Gait normal. GCS score is 15.  Skin: Skin is warm and dry.  Psychiatric: Mood, memory, affect and judgment normal.    Lab Results  Component Value Date   WBC 7.1 05/12/2016   HCT 41.6 05/12/2016   PLT 258 05/12/2016   GLUCOSE 108 (H) 05/12/2016   CHOL 195 05/12/2016   TRIG 87 05/12/2016   HDL 45 05/12/2016  LDLCALC 133 (H) 05/12/2016   TSH 6.800 (H) 05/12/2016    CMP     Component Value Date/Time   NA 137 05/12/2016 0816   K 4.7 05/12/2016 0816   CL 100 05/12/2016 0816   CO2 19 05/12/2016 0816   GLUCOSE 108 (H) 05/12/2016 0816   BUN 10 05/12/2016 0816   CREATININE 0.84 05/12/2016 0816   CALCIUM 9.4 05/12/2016 0816   PROT 7.5 05/12/2016 0816   ALBUMIN 4.4 05/12/2016 0816   AST 23 05/12/2016 0816   ALT 36 (H) 05/12/2016 0816   ALKPHOS 55 05/12/2016 0816   BILITOT 0.4 05/12/2016 0816   GFRNONAA 85 05/12/2016 0816   GFRAA 98  05/12/2016 0816    Assessment and Plan :  1. Gastroesophageal reflux disease, esophagitis presence not specified   2. Other specified hypothyroidism  - TSH  I have done the exam and reviewed the above chart and it is accurate to the best of my knowledge.  HPI, Exam, and A&P Transcribed under the direction and in the presence of Richard L. Wendelyn BreslowGilbert Jr, MD  Electronically Signed: Dimas ChyleBrittany Byrd, CMA  Julieanne Mansonichard Gilbert MD [facility] 08/18/2016 11:12 AM

## 2016-08-19 LAB — TSH: TSH: 2.22 u[IU]/mL (ref 0.450–4.500)

## 2016-08-20 ENCOUNTER — Telehealth: Payer: Self-pay

## 2016-08-20 NOTE — Telephone Encounter (Signed)
Pt advised.   Thanks,   -Laura  

## 2016-08-20 NOTE — Telephone Encounter (Signed)
-----   Message from Maple Hudsonichard L Gilbert Jr., MD sent at 08/20/2016  9:25 AM EDT ----- Thyroid dose correct

## 2016-10-22 ENCOUNTER — Emergency Department
Admission: EM | Admit: 2016-10-22 | Discharge: 2016-10-22 | Disposition: A | Discharge status: Home / Self Care | Payer: Self-pay | Attending: Emergency Medicine | Admitting: Emergency Medicine

## 2016-10-22 DIAGNOSIS — Z042 Encounter for examination and observation following work accident: Secondary | ICD-10-CM | POA: Insufficient documentation

## 2016-10-22 DIAGNOSIS — W228XXA Striking against or struck by other objects, initial encounter: Secondary | ICD-10-CM | POA: Insufficient documentation

## 2016-10-22 DIAGNOSIS — Y929 Unspecified place or not applicable: Secondary | ICD-10-CM | POA: Insufficient documentation

## 2016-10-22 DIAGNOSIS — Y990 Civilian activity done for income or pay: Secondary | ICD-10-CM | POA: Insufficient documentation

## 2016-10-22 DIAGNOSIS — Z5321 Procedure and treatment not carried out due to patient leaving prior to being seen by health care provider: Secondary | ICD-10-CM | POA: Insufficient documentation

## 2016-10-22 DIAGNOSIS — Y939 Activity, unspecified: Secondary | ICD-10-CM | POA: Insufficient documentation

## 2016-10-22 NOTE — ED Notes (Signed)
Patient ambulatory to triage with steady gait, without difficulty or distress noted; pt accomp by supervisor Paulina FusiJerry Williams; st pt here for UDS only; pt denies any c/o or desire to be evaluated by ED provider at this time; pt employ[place] [employer]; workers comp profile indicates UDS and breath analysis only on request; supervisor st only UDS to be performed; EDT Samantha in triage to complete profile using chain of custody as requested; pt instructed to return for any further concerns and voices good understanding

## 2017-02-16 ENCOUNTER — Encounter: Payer: Self-pay | Admitting: Family Medicine

## 2017-02-16 ENCOUNTER — Ambulatory Visit: Payer: Managed Care, Other (non HMO) | Admitting: Family Medicine

## 2017-02-16 ENCOUNTER — Ambulatory Visit (INDEPENDENT_AMBULATORY_CARE_PROVIDER_SITE_OTHER): Payer: Managed Care, Other (non HMO) | Admitting: Family Medicine

## 2017-02-16 VITALS — BP 122/80 | HR 78 | Temp 98.1°F | Resp 16 | Ht 64.0 in | Wt 177.0 lb

## 2017-02-16 DIAGNOSIS — Z124 Encounter for screening for malignant neoplasm of cervix: Secondary | ICD-10-CM | POA: Diagnosis not present

## 2017-02-16 DIAGNOSIS — Z1239 Encounter for other screening for malignant neoplasm of breast: Secondary | ICD-10-CM

## 2017-02-16 DIAGNOSIS — R739 Hyperglycemia, unspecified: Secondary | ICD-10-CM | POA: Diagnosis not present

## 2017-02-16 DIAGNOSIS — Z1231 Encounter for screening mammogram for malignant neoplasm of breast: Secondary | ICD-10-CM

## 2017-02-16 DIAGNOSIS — Z309 Encounter for contraceptive management, unspecified: Secondary | ICD-10-CM | POA: Diagnosis not present

## 2017-02-16 DIAGNOSIS — Z0000 Encounter for general adult medical examination without abnormal findings: Secondary | ICD-10-CM | POA: Diagnosis not present

## 2017-02-16 DIAGNOSIS — Z1211 Encounter for screening for malignant neoplasm of colon: Secondary | ICD-10-CM

## 2017-02-16 LAB — POCT URINALYSIS DIPSTICK
BILIRUBIN UA: NEGATIVE
Blood, UA: NEGATIVE
GLUCOSE UA: NEGATIVE
Ketones, UA: NEGATIVE
Leukocytes, UA: NEGATIVE
NITRITE UA: NEGATIVE
Protein, UA: NEGATIVE
SPEC GRAV UA: 1.01 (ref 1.030–1.035)
UROBILINOGEN UA: 0.2 (ref ?–2.0)
pH, UA: 7.5 (ref 5.0–8.0)

## 2017-02-16 LAB — POCT URINE PREGNANCY: PREG TEST UR: NEGATIVE

## 2017-02-16 LAB — POC HEMOCCULT BLD/STL (OFFICE/1-CARD/DIAGNOSTIC): OCCULT BLOOD DATE: NEGATIVE

## 2017-02-16 NOTE — Progress Notes (Signed)
Patient: Eileen Hooper, Female    DOB: 06/06/1971, 46 y.o.   MRN: [id number] Visit Date: 02/16/2017  Today's Provider: Megan Mansichard Kjerstin Abrigo Jr, MD   Chief Complaint  Patient presents with  . Annual Exam   Subjective:    Annual physical exam Eileen Hooper is a 46 y.o. female who presents today for health maintenance and complete physical. She feels well. She reports exercising twice a week. She reports she is sleeping well. -----------------------------------------------------------------  Pap- 11/10/06  Mammogram- 08/28/09 colonoscopy- never  She reports that she thinks she was given a Td in the hospital several years ago when she fell and went to the hospital.   Pt would also like to discuss getting started on birth control. She is recently separated from her husband. She is sexually active now and wish to start birth control for contraceptive purposes.   Review of Systems  Constitutional: Negative.   HENT: Negative.   Eyes: Negative.   Respiratory: Negative.   Cardiovascular: Negative.   Gastrointestinal: Negative.   Endocrine: Negative.   Genitourinary: Negative.   Musculoskeletal: Negative.   Skin: Negative.   Allergic/Immunologic: Negative.   Neurological: Negative.   Hematological: Negative.   Psychiatric/Behavioral: Negative.     Social History      She  reports that she has never smoked. She has never used smokeless tobacco. She reports that she drinks alcohol. She reports that she does not use drugs.       Social History   Social History  . Marital status: Married    Spouse name: N/A  . Number of children: N/A  . Years of education: N/A   Social History Main Topics  . Smoking status: Never Smoker  . Smokeless tobacco: Never Used  . Alcohol use 0.0 oz/week     Comment: socially  . Drug use: No  . Sexual activity: Not Asked   Other Topics Concern  . None   Social History Narrative  . None    History reviewed. No pertinent past medical  history.   Patient Active Problem List   Diagnosis Date Noted  . Hypothyroidism 05/14/2016  . Adiposity 05/14/2016  . GERD (gastroesophageal reflux disease) 05/07/2016    History reviewed. No pertinent surgical history.  Family History        Family Status  Relation Status  . Mother Deceased  . Father Alive  . Sister Deceased   car accident        Her family history includes Cancer in her mother; Heart disease in her father.     No Known Allergies   Current Outpatient Prescriptions:  .  levothyroxine (SYNTHROID, LEVOTHROID) 50 MCG tablet, Take 1 tablet (50 mcg total) by mouth daily., Disp: 30 tablet, Rfl: 12 .  pantoprazole (PROTONIX) 40 MG tablet, Take 1 tablet (40 mg total) by mouth daily., Disp: 60 tablet, Rfl: 12   Patient Care Team: Maple Hudsonichard L Webber Michiels Jr., MD as PCP - General (Family Medicine)      Objective:   Vitals: BP 122/80 (BP Location: Left Arm, Patient Position: Sitting, Cuff Size: Normal)   Pulse 78   Temp 98.1 F (36.7 C) (Oral)   Resp 16   Ht 5\' 4"  (1.626 m)   Wt 177 lb (80.3 kg)   LMP 01/18/2017 (Exact Date) Comment: lasting about 4-5 days  BMI 30.38 kg/m    Vitals:   02/16/17 1410  BP: 122/80  Pulse: 78  Resp: 16  Temp: 98.1 F (36.7  C)  TempSrc: Oral  Weight: 177 lb (80.3 kg)  Height: 5\' 4"  (1.626 m)     Physical Exam  Constitutional: She is oriented to person, place, and time. She appears well-developed and well-nourished.  HENT:  Head: Normocephalic and atraumatic.  Right Ear: External ear normal.  Left Ear: External ear normal.  Nose: Nose normal.  Mouth/Throat: Oropharynx is clear and moist.  Eyes: Conjunctivae and EOM are normal. Pupils are equal, round, and reactive to light.  Neck: Normal range of motion. Neck supple.  Cardiovascular: Normal rate, regular rhythm, normal heart sounds and intact distal pulses.   Pulmonary/Chest: Effort normal and breath sounds normal.  Abdominal: Soft. Bowel sounds are normal.    Genitourinary: Vagina normal and uterus normal.  Musculoskeletal: Normal range of motion.  Neurological: She is alert and oriented to person, place, and time. She has normal reflexes.  Skin: Skin is warm and dry.  Psychiatric: She has a normal mood and affect. Her behavior is normal. Judgment and thought content normal.     Depression Screen PHQ 2/9 Scores 02/16/2017 05/07/2016  PHQ - 2 Score 2 0  PHQ- 9 Score 3 -      Assessment & Plan:     Routine Health Maintenance and Physical Exam  Exercise Activities and Dietary recommendations Goals    None       There is no immunization history on file for this patient.  Health Maintenance  Topic Date Due  . HIV Screening  08/06/1987  . TETANUS/TDAP  08/06/1991  . PAP SMEAR  08/05/1993  . INFLUENZA VACCINE  07/19/2017 (Originally 07/01/2016)     Discussed health benefits of physical activity, and encouraged her to engage in regular exercise appropriate for her age and condition.    -------------------------------------------------------------------- 1. Annual physical exam  - CBC with Differential/Platelet - Lipid Panel With LDL/HDL Ratio - TSH - POCT urinalysis dipstick - Comprehensive metabolic panel  2. Screening for cervical cancer  - Pap liquid-based and HPV (high risk)  3. Colon cancer screening  - POC Hemoccult Bld/Stl (1-Cd Office Dx)--negative.  4. Encounter for contraceptive management, unspecified type I actually discussed IUD for her age/situation. Divorced and in monogamous relationshiip. - POCT urine pregnancy--negative. - Ambulatory referral to Gynecology  5. Hyperglycemia  - Hemoglobin A1c  6. Breast cancer screening  - MM DIGITAL SCREENING BILATERAL; Future   HPI, Exam, and A&P Transcribed under the direction and in the presence of Keyuana Wank L. Wendelyn Breslow, MD  Electronically Signed: Silvio Pate, CMA I have done the exam and reviewed the above chart and it is accurate to the best of my  knowledge. Dentist has been used in this note in any air is in the dictation or transcription are unintentional.   Megan Mans, MD  [facility]

## 2017-02-18 LAB — PAP LB AND HPV HIGH-RISK
HPV, HIGH-RISK: NEGATIVE
PAP Smear Comment: 0

## 2017-03-05 ENCOUNTER — Telehealth: Payer: Self-pay | Admitting: Family Medicine

## 2017-03-05 NOTE — Telephone Encounter (Signed)
Pt states she was seen 02/16/17 for her CPE.  Pt states she left a biometric screening form to be completed and faxed [employer].  Pt states Rosann Auerbach has not rec'd this form back.  CB#[phone]/MW

## 2017-03-05 NOTE — Telephone Encounter (Signed)
Waiting on her to have the labs done and then we can send the form in.  Patient reports she had the labs done this morning ED

## 2017-03-06 LAB — CBC WITH DIFFERENTIAL/PLATELET
BASOS ABS: 0.1 10*3/uL (ref 0.0–0.2)
BASOS: 1 %
EOS (ABSOLUTE): 0.3 10*3/uL (ref 0.0–0.4)
Eos: 4 %
Hematocrit: 40.5 % (ref 34.0–46.6)
Hemoglobin: 13.8 g/dL (ref 11.1–15.9)
IMMATURE GRANULOCYTES: 0 %
Immature Grans (Abs): 0 10*3/uL (ref 0.0–0.1)
Lymphocytes Absolute: 2.3 10*3/uL (ref 0.7–3.1)
Lymphs: 31 %
MCH: 30.3 pg (ref 26.6–33.0)
MCHC: 34.1 g/dL (ref 31.5–35.7)
MCV: 89 fL (ref 79–97)
MONOS ABS: 0.4 10*3/uL (ref 0.1–0.9)
Monocytes: 5 %
NEUTROS PCT: 59 %
Neutrophils Absolute: 4.3 10*3/uL (ref 1.4–7.0)
PLATELETS: 226 10*3/uL (ref 150–379)
RBC: 4.55 x10E6/uL (ref 3.77–5.28)
RDW: 14.3 % (ref 12.3–15.4)
WBC: 7.4 10*3/uL (ref 3.4–10.8)

## 2017-03-06 LAB — COMPREHENSIVE METABOLIC PANEL
A/G RATIO: 1.7 (ref 1.2–2.2)
ALT: 30 IU/L (ref 0–32)
AST: 21 IU/L (ref 0–40)
Albumin: 4.5 g/dL (ref 3.5–5.5)
Alkaline Phosphatase: 47 IU/L (ref 39–117)
BILIRUBIN TOTAL: 0.6 mg/dL (ref 0.0–1.2)
BUN/Creatinine Ratio: 16 (ref 9–23)
BUN: 14 mg/dL (ref 6–24)
CALCIUM: 9.2 mg/dL (ref 8.7–10.2)
CHLORIDE: 98 mmol/L (ref 96–106)
CO2: 25 mmol/L (ref 18–29)
Creatinine, Ser: 0.88 mg/dL (ref 0.57–1.00)
GFR calc Af Amer: 92 mL/min/{1.73_m2} (ref >59)
GFR calc non Af Amer: 80 mL/min/{1.73_m2} (ref >59)
Globulin, Total: 2.7 g/dL (ref 1.5–4.5)
Glucose: 92 mg/dL (ref 65–99)
POTASSIUM: 4.4 mmol/L (ref 3.5–5.2)
Sodium: 137 mmol/L (ref 134–144)
Total Protein: 7.2 g/dL (ref 6.0–8.5)

## 2017-03-06 LAB — HEMOGLOBIN A1C
ESTIMATED AVERAGE GLUCOSE: 114 mg/dL
Hgb A1c MFr Bld: 5.6 % (ref 4.8–5.6)

## 2017-03-06 LAB — LIPID PANEL WITH LDL/HDL RATIO
Cholesterol, Total: 191 mg/dL (ref 100–199)
HDL: 52 mg/dL (ref >39)
LDL Calculated: 102 mg/dL — ABNORMAL HIGH (ref 0–99)
LDl/HDL Ratio: 2 ratio (ref 0.0–3.2)
Triglycerides: 184 mg/dL — ABNORMAL HIGH (ref 0–149)
VLDL Cholesterol Cal: 37 mg/dL (ref 5–40)

## 2017-03-06 LAB — TSH: TSH: 3.23 u[IU]/mL (ref 0.450–4.500)

## 2017-03-09 NOTE — Telephone Encounter (Signed)
I have this form ready, if you will review the labs so I can tell her when I call. Thanks.

## 2017-03-09 NOTE — Progress Notes (Signed)
Patient advised  ED 

## 2017-03-09 NOTE — Telephone Encounter (Signed)
Done

## 2017-03-17 ENCOUNTER — Encounter: Payer: Managed Care, Other (non HMO) | Admitting: Obstetrics and Gynecology

## 2017-03-19 ENCOUNTER — Encounter: Payer: Managed Care, Other (non HMO) | Admitting: Obstetrics and Gynecology

## 2017-04-01 ENCOUNTER — Encounter: Payer: Managed Care, Other (non HMO) | Admitting: Obstetrics and Gynecology

## 2017-04-07 ENCOUNTER — Encounter: Payer: Self-pay | Admitting: Obstetrics and Gynecology

## 2017-04-07 ENCOUNTER — Ambulatory Visit (INDEPENDENT_AMBULATORY_CARE_PROVIDER_SITE_OTHER): Payer: Managed Care, Other (non HMO) | Admitting: Obstetrics and Gynecology

## 2017-04-07 VITALS — BP 117/77 | HR 65 | Ht 66.0 in | Wt 171.2 lb

## 2017-04-07 DIAGNOSIS — Z3009 Encounter for other general counseling and advice on contraception: Secondary | ICD-10-CM | POA: Diagnosis not present

## 2017-04-07 NOTE — Progress Notes (Signed)
GYNECOLOGY CLINIC PROGRESS NOTE  Subjective:    Eileen Hooper is a 46 y.o. 301P0010 female who presents for contraception counseling. The patient has no complaints today. The patient is sexually active.  The patient notes that she and her husband of 20 years are going through a divorce, he previously had a vasectomy.  Pertinent past medical history: none.  Menstrual History: OB History    Gravida Para Term Preterm AB Living   1       1     SAB TAB Ectopic Multiple Live Births                  Menarche age: [age] Patient's last menstrual period was 03/22/2017. Period Cycle (Days): 28 Period Duration (Days): 4-5 Period Pattern: Regular Menstrual Flow: Moderate Dysmenorrhea: None   Past Medical History:  Diagnosis Date  . GERD (gastroesophageal reflux disease)   . Thyroid disease     Family History  Problem Relation Age of Onset  . Cancer Mother     lung  . Heart disease Father     Past Surgical History:  Procedure Laterality Date  . NO PAST SURGERIES      Social History   Social History  . Marital status: Married    Spouse name: N/A  . Number of children: N/A  . Years of education: N/A   Occupational History  . Not on file.   Social History Main Topics  . Smoking status: Never Smoker  . Smokeless tobacco: Never Used  . Alcohol use 0.0 oz/week     Comment: socially  . Drug use: No  . Sexual activity: Yes    Birth control/ protection: None   Other Topics Concern  . Not on file   Social History Narrative  . No narrative on file    Current Outpatient Prescriptions on File Prior to Visit  Medication Sig Dispense Refill  . levothyroxine (SYNTHROID, LEVOTHROID) 50 MCG tablet Take 1 tablet (50 mcg total) by mouth daily. 30 tablet 12  . pantoprazole (PROTONIX) 40 MG tablet Take 1 tablet (40 mg total) by mouth daily. 60 tablet 12   No current facility-administered medications on file prior to visit.     No Known Allergies   Review of  Systems Constitutional: negative for chills, fatigue, fevers and sweats Eyes: negative for irritation, redness and visual disturbance Ears, nose, mouth, throat, and face: negative for hearing loss, nasal congestion, snoring and tinnitus Respiratory: negative for asthma, cough, sputum Cardiovascular: negative for chest pain, dyspnea, exertional chest pressure/discomfort, irregular heart beat, palpitations and syncope Gastrointestinal: negative for abdominal pain, change in bowel habits, nausea and vomiting Genitourinary: negative for abnormal menstrual periods, genital lesions, sexual problems and vaginal discharge, dysuria and urinary incontinence Integument/breast: negative for breast lump, breast tenderness and nipple discharge Hematologic/lymphatic: negative for bleeding and easy bruising Musculoskeletal:negative for back pain and muscle weakness Neurological: negative for dizziness, headaches, vertigo and weakness Endocrine: negative for diabetic symptoms including polydipsia, polyuria and skin dryness.  Positive for occasional hot flushes and mood changes.  Allergic/Immunologic: negative for hay fever and urticaria      Objective:    BP 117/77 (BP Location: Left Arm, Patient Position: Sitting, Cuff Size: Large)   Pulse 65   Ht 5\' 6"  (1.676 m)   Wt 171 lb 3.2 oz (77.7 kg)   LMP 03/22/2017   BMI 27.63 kg/m  General appearance: alert and no distress Lungs: clear to auscultation bilaterally Heart: regular rate and rhythm, S1, S2  normal, no murmur, click, rub or gallop Abdomen: soft, non-tender; bowel sounds normal; no masses,  no organomegaly Pelvic: deferred   Assessment:    46 y.o. female, desiring contraception, no contraindications.   Plan:   Reviewed all forms of birth control options available including abstinence; over the counter/barrier methods; hormonal contraceptive medication including pill, patch, ring, injection,contraceptive implant; hormonal and nonhormonal IUDs;  permanent sterilization options including vasectomy and the various tubal sterilization modalities. Risks and benefits reviewed.  Questions were answered.  Information was given to patient to review.  Patient considering Mirena IUD.  Will schedule for insertion next week during menstrual cycle.     A total of 20 minutes were spent face-to-face with the patient during the encounter with greater than 50% dealing with counseling and coordination of care.   Hildred Laser, MD [facility]

## 2017-04-16 ENCOUNTER — Ambulatory Visit (INDEPENDENT_AMBULATORY_CARE_PROVIDER_SITE_OTHER): Payer: Managed Care, Other (non HMO) | Admitting: Obstetrics and Gynecology

## 2017-04-16 ENCOUNTER — Encounter: Payer: Self-pay | Admitting: Obstetrics and Gynecology

## 2017-04-16 VITALS — BP 128/87 | HR 75 | Ht 66.0 in | Wt 171.0 lb

## 2017-04-16 DIAGNOSIS — Z30430 Encounter for insertion of intrauterine contraceptive device: Secondary | ICD-10-CM | POA: Diagnosis not present

## 2017-04-16 LAB — POCT URINE PREGNANCY: Preg Test, Ur: NEGATIVE

## 2017-04-16 NOTE — Patient Instructions (Signed)

## 2017-04-16 NOTE — Progress Notes (Signed)
    GYNECOLOGY OFFICE PROCEDURE NOTE  Eileen Hooper is a 46 y.o. G1P0010 here for Mirena IUD insertion. No GYN concerns.  Last pap smear was on 02/16/2017 and was normal.  IUD Insertion Procedure Note Patient identified, informed consent performed, consent signed.   Discussed risks of irregular bleeding, cramping, infection, malpositioning or misplacement of the IUD outside the uterus which may require further procedure such as laparoscopy. Time out was performed.  Urine pregnancy test negative.  Speculum placed in the vagina.  Cervix visualized.  Cleaned with Betadine x 2.  Grasped anteriorly with a single tooth tenaculum. The cervix was dilated using dilators. Uterus sounded to 7 cm.  Mirena IUD placed per manufacturer's recommendations.  Strings trimmed to 3 cm. Tenaculum was removed, good hemostasis noted.  Patient tolerated procedure well.   Patient was given post-procedure instructions.  She was advised to have backup contraception for one week.  Patient was also asked to check IUD strings periodically and follow up in 4 weeks for IUD check.   Lot: [id number] Exp: 08/2019   Hildred Laserherry, Morene Cecilio, MD [facility]

## 2017-05-13 ENCOUNTER — Other Ambulatory Visit: Payer: Self-pay | Admitting: Family Medicine

## 2017-05-13 DIAGNOSIS — K219 Gastro-esophageal reflux disease without esophagitis: Secondary | ICD-10-CM

## 2017-05-15 ENCOUNTER — Ambulatory Visit (INDEPENDENT_AMBULATORY_CARE_PROVIDER_SITE_OTHER): Payer: Managed Care, Other (non HMO) | Admitting: Obstetrics and Gynecology

## 2017-05-15 VITALS — BP 127/74 | HR 59 | Ht 66.0 in | Wt 178.6 lb

## 2017-05-15 DIAGNOSIS — Z30431 Encounter for routine checking of intrauterine contraceptive device: Secondary | ICD-10-CM | POA: Diagnosis not present

## 2017-05-15 NOTE — Patient Instructions (Signed)

## 2017-05-15 NOTE — Progress Notes (Signed)
     GYNECOLOGY OFFICE PROGRESS NOTE  History:  46 y.o. G1P0010 here today for today for IUD string check; Mirena IUD was placed  04/16/2017. No complaints about the IUD, no concerning side effects.  The following portions of the patient's history were reviewed and updated as appropriate: allergies, current medications, past family history, past medical history, past social history, past surgical history and problem list. Last pap smear on 02/16/2017 was normal, negative HRHPV.  Review of Systems:  Pertinent items are noted in HPI.   Objective:  Physical Exam Blood pressure 127/74, pulse (!) 59, height 5\' 6"  (1.676 m), weight 178 lb 9.6 oz (81 kg), last menstrual period 04/16/2017. CONSTITUTIONAL: Well-developed, well-nourished female in no acute distress.  ABDOMEN: Soft, no distention noted.   PELVIC: Normal appearing external genitalia; normal appearing vaginal mucosa and cervix.  IUD strings visualized, about 3 cm in length outside cervix.  EXTREMITIES:  extremities normal, atraumatic, no cyanosis or edema NEUROLOGIC: Grossly normal  Assessment & Plan:  Normal IUD check. Patient to keep IUD in place for five years; can come in for removal if she desires pregnancy within the next five years. Routine preventative health maintenance measures emphasized.   Hildred Laserherry, Corrina Steffensen, MD [facility]

## 2017-06-14 ENCOUNTER — Other Ambulatory Visit: Payer: Self-pay | Admitting: Family Medicine

## 2017-06-29 ENCOUNTER — Encounter: Payer: Self-pay | Admitting: Podiatry

## 2017-06-29 ENCOUNTER — Ambulatory Visit (INDEPENDENT_AMBULATORY_CARE_PROVIDER_SITE_OTHER): Payer: 59 | Admitting: Podiatry

## 2017-06-29 ENCOUNTER — Ambulatory Visit (INDEPENDENT_AMBULATORY_CARE_PROVIDER_SITE_OTHER): Payer: 59

## 2017-06-29 VITALS — BP 128/80 | HR 60 | Resp 16

## 2017-06-29 DIAGNOSIS — M7752 Other enthesopathy of left foot: Secondary | ICD-10-CM | POA: Diagnosis not present

## 2017-06-29 DIAGNOSIS — M2142 Flat foot [pes planus] (acquired), left foot: Secondary | ICD-10-CM | POA: Diagnosis not present

## 2017-06-29 DIAGNOSIS — M778 Other enthesopathies, not elsewhere classified: Secondary | ICD-10-CM

## 2017-06-29 DIAGNOSIS — L603 Nail dystrophy: Secondary | ICD-10-CM

## 2017-06-29 DIAGNOSIS — M2012 Hallux valgus (acquired), left foot: Secondary | ICD-10-CM | POA: Diagnosis not present

## 2017-06-29 DIAGNOSIS — M779 Enthesopathy, unspecified: Secondary | ICD-10-CM

## 2017-06-29 DIAGNOSIS — M2141 Flat foot [pes planus] (acquired), right foot: Secondary | ICD-10-CM | POA: Diagnosis not present

## 2017-06-29 NOTE — Progress Notes (Signed)
   Subjective:    Patient ID: Eileen Hooper, female    DOB: 09/26/1971, 46 y.o.   MRN: [id number]  HPI: She presents today with a chief concern of a thick discolored nail to the hallux right times several years. She also concerned about her bunion deformity her left foot. She states is really not painful today but I concerned that it may be getting flareups or gout attacks. She denies a history of gout and denies that she is ever actually had a test for gout. She states that the majority of his time will become sores when she is at work toward the end of the day.    Review of Systems  All other systems reviewed and are negative.      Objective:   Physical Exam: Vital signs are stable she is alert and oriented 3. Pulse are palpable. Deep tendon reflexes are intact. Neurologic sensorium is intact. Muscle strength is normal symmetrical bilateral. Orthopedic evaluation was resolved once distal to the ankle for range of motion and crepitation. Cutaneous evaluation demonstrates supple well-hydrated cutis. Chest thick yellow dystrophic onychomycotic nails she also has pain and swelling of the first metatarsophalangeal joint of the left foot with hallux valgus deformity. Radiographs taken today do not demonstrate any type of history of gout as she does have hallux valgus deformity with tenderness on palpation and range of motion of the first metatarsophalangeal joint.        Assessment & Plan:  Hallux abductovalgus for capsulitis left. Right hallux nail is nail dystrophy possibly onychomycosis.  Plan: To samples of the toenail today to be sent for pathologic evaluation. Operative injection to the first metatarsophalangeal joint she declined. She would like to consider orthotics next visit.

## 2017-07-08 ENCOUNTER — Other Ambulatory Visit: Payer: 59 | Admitting: Orthotics

## 2017-07-27 ENCOUNTER — Ambulatory Visit: Payer: 59 | Admitting: Podiatry

## 2017-08-12 ENCOUNTER — Ambulatory Visit (INDEPENDENT_AMBULATORY_CARE_PROVIDER_SITE_OTHER): Payer: 59 | Admitting: Podiatry

## 2017-08-12 ENCOUNTER — Encounter: Payer: Self-pay | Admitting: Podiatry

## 2017-08-12 DIAGNOSIS — L603 Nail dystrophy: Secondary | ICD-10-CM | POA: Diagnosis not present

## 2017-08-12 DIAGNOSIS — Z79899 Other long term (current) drug therapy: Secondary | ICD-10-CM | POA: Diagnosis not present

## 2017-08-12 MED ORDER — TERBINAFINE HCL 250 MG PO TABS: 250.0000 mg | ORAL_TABLET | Freq: Every day | ORAL | 0 refills | Status: DC

## 2017-08-12 NOTE — Patient Instructions (Signed)

## 2017-08-12 NOTE — Progress Notes (Signed)
She presents today for follow-up of her pathology report.  Objective: Pathology reports states onychomycosis with a dermatophyte.  Assessment: Onychomycosis dermatophyte.  Plan: After thorough discussion of our options today she chose oral therapy. Pros and cons of oral therapy were discussed in great detail today she understands this is amenable to the risk. She also states that she will have an oral than anything else. We started her on oral Lamisil 250 mg tablets 1 by mouth daily and also requested a liver profile. I will follow-up with her in 1 month or call her should her liver profile be abnormal.

## 2017-09-23 ENCOUNTER — Ambulatory Visit: Payer: 59 | Admitting: Podiatry

## 2017-10-28 ENCOUNTER — Ambulatory Visit: Payer: Self-pay | Admitting: Emergency Medicine

## 2017-10-28 VITALS — BP 130/90 | HR 85 | Temp 98.3°F | Resp 16

## 2017-10-28 DIAGNOSIS — H66002 Acute suppurative otitis media without spontaneous rupture of ear drum, left ear: Secondary | ICD-10-CM

## 2017-10-28 MED ORDER — AMOXICILLIN-POT CLAVULANATE 875-125 MG PO TABS: 1.0000 | ORAL_TABLET | Freq: Two times a day (BID) | ORAL | 0 refills | Status: DC

## 2017-10-28 NOTE — Progress Notes (Signed)
S:  Comes in today with upper respiratory sx.  She also had some fever which she believes broke a couple of days ago.  She cotninues to cough.  No vomiting or dirarrhea.  No fever or chills. O:  TMS dull with left TM erythematous. Mild fluid present. Posterior drainage present. Lungs are clear bilaterally.Heart regular rate and rhythm. A:  URI with left otitis media P: Augmentin 875mg  bid x 10 days

## 2017-11-02 ENCOUNTER — Ambulatory Visit: Payer: Self-pay

## 2018-02-02 ENCOUNTER — Encounter: Payer: Self-pay | Admitting: Family Medicine

## 2018-02-02 ENCOUNTER — Ambulatory Visit: Payer: Self-pay | Admitting: Family Medicine

## 2018-02-02 VITALS — BP 110/82 | HR 59 | Temp 97.7°F | Resp 16 | Ht 66.0 in | Wt 179.0 lb

## 2018-02-02 DIAGNOSIS — Z299 Encounter for prophylactic measures, unspecified: Secondary | ICD-10-CM

## 2018-02-02 NOTE — Progress Notes (Signed)
Subjective: Annual biometrics screening  Patient has a history of hypothyroidism and GERD.  Patient reports that she stopped taking her levothyroxine a couple of months ago because she felt like it was making her eat more.  Patient has still been filling this prescription every month but not taking it.  Patient reports a small amount of weight gain since that time but is unsure how much.  Patient reports IUD placement last May with occasional intermittent spotting.  Denies any other symptoms or complaints.  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  Patient denies regular exercise and reports an irregular sleep schedule due to the nature of her job.  Patient reports that she has been eating a healthy, well-rounded diet for the last few months.  No significant surgical history.  Patient's PCP is Dr. Sullivan LoneGilbert.  Review of Systems Constitutional: Unremarkable.  HEENT: Denies dizziness, issues with hearing, vision problems.  Gastrointestinal: Denies issues with bowel or bladder.  Respiratory: Unremarkable.   Cardiovascular: Unremarkable.  Musculoskeletal: No arthralgias, myalgias, joint swelling, joint stiffness, back pain, neck pain. ROS otherwise negative.   Objective  Physical Exam General: Awake, alert and oriented. No acute distress. Well developed, hydrated and nourished. Appears stated age.  HEENT:  Supple neck without adenopathy. Sclera is non-icteric. The ear canal is clear without discharge. The tympanic membrane is normal in appearance with normal landmarks and cone of light. Nasal mucosa is pink and moist. Oral mucosa is pink and moist. The pharynx is normal in appearance without tonsillar swelling or exudates.  Skin: Skin in warm, dry and intact without rashes or lesions. Appropriate color for ethnicity. Cardiac: Heart rate and rhythm are normal. No murmurs, gallops, or rubs are auscultated.  Respiratory: The chest wall is symmetric and without deformity. No  signs of respiratory distress. Lung sounds are clear in all lobes bilaterally without rales, ronchi, or wheezes.  Abdominal: Abdomen is soft, symmetric, and non-tender without distention. No masses, hepatomegaly, or splenomegaly are noted.  Spine: Neck and back are without deformity. Curvature of the cervical, thoracic, and lumbar spine are within normal limits. Posture is upright, gait is smooth, steady, and within normal limits. No tenderness noted on palpation of the spinous processes. Spinous processes are midline. No discomfort is noted with flexion, extension, and side-to-side rotation of the cervical spine, full range of motion is noted. Full range of motion including flexion, extension, and side-to-side rotation of the thoracic and lumbar spine are noted and without discomfort. Grip strength is normal bilaterally.   Extremities: Full range of motion is noted to all major joints. Steady gait noted.  Neurological: The patient is awake, alert and oriented to person, place, and time with normal speech.Memory is normal and thought processes intact. No gait abnormalities are appreciated.  Psychiatric: Appropriate mood and affect.   Assessment Annual biometrics screen  Plan  Labs pending. Encouraged routine visits with primary care provider.  Discussed possible complications of untreated hypothyroidism and encouraged patient to see her primary care provider regarding this. Encourage following up with OB/GYN regarding her concerns about spotting.

## 2018-02-03 LAB — CMP12+LP+TP+TSH+6AC+CBC/D/PLT
A/G RATIO: 1.9 (ref 1.2–2.2)
ALT: 18 IU/L (ref 0–32)
AST: 17 IU/L (ref 0–40)
Albumin: 4.5 g/dL (ref 3.5–5.5)
Alkaline Phosphatase: 44 IU/L (ref 39–117)
BASOS ABS: 0.1 10*3/uL (ref 0.0–0.2)
BILIRUBIN TOTAL: 0.3 mg/dL (ref 0.0–1.2)
BUN / CREAT RATIO: 13 (ref 9–23)
BUN: 12 mg/dL (ref 6–24)
Basos: 1 %
CREATININE: 0.95 mg/dL (ref 0.57–1.00)
Calcium: 9.8 mg/dL (ref 8.7–10.2)
Chloride: 102 mmol/L (ref 96–106)
Chol/HDL Ratio: 4.9 ratio — ABNORMAL HIGH (ref 0.0–4.4)
Cholesterol, Total: 211 mg/dL — ABNORMAL HIGH (ref 100–199)
EOS (ABSOLUTE): 0.4 10*3/uL (ref 0.0–0.4)
EOS: 5 %
Estimated CHD Risk: 1.3 times avg. — ABNORMAL HIGH (ref 0.0–1.0)
Free Thyroxine Index: 1.2 (ref 1.2–4.9)
GFR calc non Af Amer: 73 mL/min/{1.73_m2} (ref >59)
GFR, EST AFRICAN AMERICAN: 84 mL/min/{1.73_m2} (ref >59)
GGT: 18 IU/L (ref 0–60)
Globulin, Total: 2.4 g/dL (ref 1.5–4.5)
Glucose: 102 mg/dL — ABNORMAL HIGH (ref 65–99)
HDL: 43 mg/dL (ref >39)
HEMOGLOBIN: 14 g/dL (ref 11.1–15.9)
Hematocrit: 42.3 % (ref 34.0–46.6)
IMMATURE GRANS (ABS): 0 10*3/uL (ref 0.0–0.1)
IMMATURE GRANULOCYTES: 0 %
IRON: 85 ug/dL (ref 27–159)
LDH: 120 IU/L (ref 119–226)
LDL Calculated: 139 mg/dL — ABNORMAL HIGH (ref 0–99)
LYMPHS: 23 %
Lymphocytes Absolute: 1.7 10*3/uL (ref 0.7–3.1)
MCH: 29.8 pg (ref 26.6–33.0)
MCHC: 33.1 g/dL (ref 31.5–35.7)
MCV: 90 fL (ref 79–97)
MONOCYTES: 6 %
Monocytes Absolute: 0.5 10*3/uL (ref 0.1–0.9)
NEUTROS PCT: 65 %
Neutrophils Absolute: 4.7 10*3/uL (ref 1.4–7.0)
PLATELETS: 270 10*3/uL (ref 150–379)
Phosphorus: 3.8 mg/dL (ref 2.5–4.5)
Potassium: 4.6 mmol/L (ref 3.5–5.2)
RBC: 4.7 x10E6/uL (ref 3.77–5.28)
RDW: 13.9 % (ref 12.3–15.4)
Sodium: 138 mmol/L (ref 134–144)
T3 UPTAKE RATIO: 25 % (ref 24–39)
T4, Total: 4.8 ug/dL (ref 4.5–12.0)
TOTAL PROTEIN: 6.9 g/dL (ref 6.0–8.5)
TRIGLYCERIDES: 146 mg/dL (ref 0–149)
TSH: 3.96 u[IU]/mL (ref 0.450–4.500)
URIC ACID: 5.4 mg/dL (ref 2.5–7.1)
VLDL CHOLESTEROL CAL: 29 mg/dL (ref 5–40)
WBC: 7.4 10*3/uL (ref 3.4–10.8)

## 2018-02-03 NOTE — Progress Notes (Signed)
Will you call the patient and inform her of her lab results?  Please inform her that her glucose level is slightly elevated at 102.  Normal values are between 65 and 99 if the patient is fasting.  I am unsure if this patient was truly fasting and if she was not truly fasting then this is a normal value.  If the patient really was fasting then  this may represent impaired fasting glucose ("prediabetes"), but would require further testing to make this determination.  Also inform her that her total cholesterol was elevated at 211.  The normal value is between 100 and 199.  Inform her that her LDL cholesterol  ("bad cholesterol") is elevated at 139.  The normal value for this is between 0 and 99.  These elevated cholesterol values increase her risk for coronary heart disease.  Inform her that her TSH is normal.  Please advise her to follow-up with her primary care provider regarding these results and forward the lab results to Dr. Sullivan LoneGilbert.

## 2018-02-04 ENCOUNTER — Telehealth: Payer: Self-pay

## 2018-02-04 NOTE — Telephone Encounter (Signed)
Mailbox full and can not leave message  ----- Message from Maple Hudsonichard L Gilbert Jr., MD sent at 02/03/2018  1:36 PM EST ----- Diet and exercise and see me 3 months for prediabetes--thx

## 2018-02-04 NOTE — Telephone Encounter (Signed)
-----   Message from Maple Hudsonichard L Gilbert Jr., MD sent at 02/03/2018  1:36 PM EST ----- Diet and exercise and see me 3 months for prediabetes--thx

## 2018-02-08 NOTE — Telephone Encounter (Signed)
Advised  ED 

## 2018-06-14 ENCOUNTER — Other Ambulatory Visit: Payer: Self-pay | Admitting: Family Medicine

## 2018-06-14 DIAGNOSIS — K219 Gastro-esophageal reflux disease without esophagitis: Secondary | ICD-10-CM

## 2018-06-14 NOTE — Telephone Encounter (Signed)
Pharmacy requesting refills. Thanks!  

## 2018-09-24 ENCOUNTER — Ambulatory Visit: Payer: Self-pay | Admitting: Family Medicine

## 2018-09-24 VITALS — BP 122/84 | HR 84 | Temp 98.4°F | Resp 14 | Ht 66.0 in | Wt 180.0 lb

## 2018-09-24 DIAGNOSIS — H6983 Other specified disorders of Eustachian tube, bilateral: Secondary | ICD-10-CM

## 2018-09-24 DIAGNOSIS — J019 Acute sinusitis, unspecified: Secondary | ICD-10-CM

## 2018-09-24 MED ORDER — FLUTICASONE PROPIONATE 50 MCG/ACT NA SUSP: 2.0000 | Freq: Every day | NASAL | 0 refills | Status: DC

## 2018-09-24 MED ORDER — AMOXICILLIN-POT CLAVULANATE 875-125 MG PO TABS: 1.0000 | ORAL_TABLET | Freq: Two times a day (BID) | ORAL | 0 refills | Status: AC

## 2018-09-24 NOTE — Progress Notes (Signed)
Subjective: Congestion     Eileen Hooper is a 47 y.o. female who presents for evaluation of nasal congestion, bilateral facial pressure, sore throat, bilateral ear fullness, and nonproductive cough since Tuesday.  Patient initially only had symptoms of chills, body aches, nonproductive cough, nasal congestion, and sore throat but reports that on Wednesday she developed facial pressure and her chills and body aches resolved.  Patient reports possible slight improvement in symptoms since waking up this morning. Treatment to date: Sudafed, Echinacea, Advil.  Denies rash, nausea, vomiting, diarrhea, SOB, wheezing, chest or back pain, ear pain,  difficulty swallowing, confusion, purulent nasal discharge, anosmia/hyposmia, dental pain, headache, fatigue, fever, ocular pruritis/discharge, or severe symptoms.  History of smoking, asthma, COPD: Negative History of recurrent sinus and/or lung infections: Negative. Medical history: Patient reports GERD and hypothyroidism only.  Patient reports she is currently not being treated for hypothyroidism due to normal labs. Antibiotic use in the last 3 months: Patient denies.   Review of Systems Pertinent items noted in HPI and remainder of comprehensive ROS otherwise negative.     Objective:   Physical Exam General: Awake, alert, and oriented. No acute distress. Well developed, hydrated and nourished. Appears stated age. Nontoxic appearance.  HEENT:  PND noted.  Mild erythema to posterior oropharynx.  No edema or exudates of pharynx or tonsils. No erythema or bulging of TM.  Full tympanic membranes with clear air/fluid level noted bilaterally.  Able to visualize landmarks.  No opaque fluid.  Ear canals clear.  Mild erythema/edema to nasal mucosa.  Bilateral frontal sinus tenderness.  Remainder of sinuses nontender. Supple neck without adenopathy. Cardiac: Heart rate and rhythm are normal. No murmurs, gallops, or rubs are auscultated. S1 and S2 are heard and are of  normal intensity.  Respiratory: No signs of respiratory distress. Lungs clear. No tachypnea. Able to speak in full sentences without dyspnea. Nonlabored respirations.  Skin: Skin is warm, dry and intact. Appropriate color for ethnicity. No cyanosis noted.   Assessment:    Sinusitis Eustachian tube dysfunction  Plan:    Discussed diagnosis and treatment of sinusitis and eustachian tube dysfunction. Discussed the importance of avoiding unnecessary antibiotic therapy. Delayed antibiotic prescription: Prescribed Augmentin and provided patient with detailed instructions on indications to get this filled.  Advised her that an antibiotic is not indicated at this time.  Discussed side/adverse effects. Suggested symptomatic OTC remedies. Nasal saline spray for congestion.   Prescribed Flonase for eustachian tube dysfunction.  Patient denies any contraindications to use.  Provided patient with instructions for use and discussed side/adverse effects. Discussed red flag symptoms and circumstances with which to seek medical care.   New Prescriptions   AMOXICILLIN-CLAVULANATE (AUGMENTIN) 875-125 MG TABLET    Take 1 tablet by mouth 2 (two) times daily for 10 days.   FLUTICASONE (FLONASE) 50 MCG/ACT NASAL SPRAY    Place 2 sprays into both nostrils daily.

## 2018-11-12 ENCOUNTER — Other Ambulatory Visit: Payer: Self-pay | Admitting: Family Medicine

## 2018-11-12 DIAGNOSIS — K219 Gastro-esophageal reflux disease without esophagitis: Secondary | ICD-10-CM

## 2019-02-09 ENCOUNTER — Ambulatory Visit: Payer: Managed Care, Other (non HMO) | Admitting: Adult Health

## 2019-02-09 ENCOUNTER — Encounter: Payer: Self-pay | Admitting: Adult Health

## 2019-02-09 ENCOUNTER — Other Ambulatory Visit: Payer: Self-pay

## 2019-02-09 VITALS — BP 124/80 | HR 89 | Resp 14 | Ht 66.0 in | Wt 186.0 lb

## 2019-02-09 DIAGNOSIS — Z008 Encounter for other general examination: Secondary | ICD-10-CM

## 2019-02-09 DIAGNOSIS — Z0189 Encounter for other specified special examinations: Secondary | ICD-10-CM

## 2019-02-09 DIAGNOSIS — K219 Gastro-esophageal reflux disease without esophagitis: Secondary | ICD-10-CM | POA: Diagnosis not present

## 2019-02-09 MED ORDER — PANTOPRAZOLE SODIUM 40 MG PO TBEC: 40.0000 mg | DELAYED_RELEASE_TABLET | Freq: Every day | ORAL | 0 refills | Status: DC

## 2019-02-09 NOTE — Progress Notes (Signed)
[facility]  Subjective:     Patient ID: Eileen Hooper, female   DOB: 01-20-1971, 48 y.o.   MRN: [id number]  HPI  Blood pressure 124/80, pulse 89, resp. rate 14, height 5\' 6"  (1.676 m), weight 186 lb (84.4 kg), SpO2 97 %.  Patient is a 48 year female in no acute distress who comes to the clinic for a biometric screening with brief biometric screening with labs include fasting glucose and lipids.   Primary care physician  Maple Hudson., MD   She has a history of Gastroesophageal reflux disease, denies any melena.She denies constipation or diarrhea.  She has controlled GERD.  Patient  denies any fever, body aches,chills, rash, chest pain, shortness of breath, nausea, vomiting, or diarrhea.    No Known Allergies    Current Outpatient Medications:  .  fluticasone (FLONASE) 50 MCG/ACT nasal spray, Place 2 sprays into both nostrils daily., Disp: 16 g, Rfl: 0 .  levonorgestrel (MIRENA) 20 MCG/24HR IUD, 1 each by Intrauterine route once., Disp: , Rfl:  .  pantoprazole (PROTONIX) 40 MG tablet, Take 1 tablet (40 mg total) by mouth daily., Disp: 90 tablet, Rfl: 0   Patient Active Problem List   Diagnosis Date Noted  . Hypothyroidism 05/14/2016  . Adiposity 05/14/2016  . GERD (gastroesophageal reflux disease) 05/07/2016    Review of Systems  Constitutional: Negative for activity change, appetite change, chills, diaphoresis, fatigue, fever and unexpected weight change.  HENT: Negative.   Eyes: Negative.   Respiratory: Negative.   Cardiovascular: Negative.   Gastrointestinal: Negative for abdominal distention, abdominal pain, anal bleeding, blood in stool, constipation, diarrhea, nausea, rectal pain and vomiting.       History of Gastro reflux disease  She reports she ran Protonix 40 mg she takes one tablet daily.    Endocrine: Negative.   Genitourinary: Negative.   Musculoskeletal: Negative.   Skin: Negative.   Allergic/Immunologic:  Negative for environmental allergies, food allergies and immunocompromised state.  Neurological: Negative.   Hematological: Negative.   Psychiatric/Behavioral: Negative.        Objective:   Physical Exam Vitals signs reviewed.  Constitutional:      General: She is not in acute distress.    Appearance: Normal appearance. She is well-developed and well-groomed. She is obese. She is not ill-appearing, toxic-appearing or diaphoretic.     Comments: Patient is alert and oriented and responsive to questions Engages in eye contact with provider. Speaks in full sentences without any pauses without any shortness of breath or distress.    HENT:     Head: Normocephalic and atraumatic.     Right Ear: There is no impacted cerumen.     Left Ear: There is no impacted cerumen.     Nose: Nose normal. No congestion or rhinorrhea.     Mouth/Throat:     Mouth: Mucous membranes are moist.     Pharynx: No oropharyngeal exudate or posterior oropharyngeal erythema.  Eyes:     General: No scleral icterus.       Right eye: No discharge.        Left eye: No discharge.     Extraocular Movements: Extraocular movements intact.     Conjunctiva/sclera: Conjunctivae normal.     Pupils: Pupils are equal, round, and reactive to light.  Neck:     Musculoskeletal: Normal range of motion and neck supple.  Cardiovascular:     Rate and Rhythm: Normal rate and regular rhythm.  Pulses: Normal pulses.     Heart sounds: Normal heart sounds. No murmur. No friction rub. No gallop.   Pulmonary:     Effort: Pulmonary effort is normal. No respiratory distress.     Breath sounds: Normal breath sounds. No stridor. No wheezing, rhonchi or rales.  Chest:     Chest wall: No tenderness.  Abdominal:     General: There is no distension.     Palpations: Abdomen is soft. There is no mass.     Tenderness: There is no abdominal tenderness. There is no right CVA tenderness, left CVA tenderness, guarding or rebound.     Hernia: No  hernia is present.  Musculoskeletal: Normal range of motion.     Comments: Patient moves on and off of exam table and in room without difficulty. Gait is normal in hall and in room. Patient is oriented to person place time and situation. Patient answers questions appropriately and engages in conversation.  Lymphadenopathy:     Cervical: No cervical adenopathy.     Right cervical: No superficial, deep or posterior cervical adenopathy.    Left cervical: No superficial, deep or posterior cervical adenopathy.     Lower Body: No right inguinal adenopathy. No left inguinal adenopathy.  Skin:    General: Skin is warm and dry.     Capillary Refill: Capillary refill takes less than 2 seconds.     Findings: No rash.     Nails: There is no clubbing.   Neurological:     General: No focal deficit present.     Mental Status: She is alert and oriented to person, place, and time.     GCS: GCS eye subscore is 4. GCS verbal subscore is 5. GCS motor subscore is 6.     Cranial Nerves: Cranial nerves are intact.     Sensory: Sensation is intact.     Motor: Motor function is intact.     Coordination: Coordination is intact.     Gait: Gait is intact. Gait normal.  Psychiatric:        Attention and Perception: Attention normal.        Mood and Affect: Mood normal.        Speech: Speech normal.        Behavior: Behavior normal. Behavior is cooperative.        Thought Content: Thought content normal.        Cognition and Memory: Cognition normal.        Judgment: Judgment normal.        Assessment:     Encounter for biometric screening - Plan: Glucose, random, Lipid Panel With LDL/HDL Ratio  Gastroesophageal reflux disease, esophagitis presence not specified - Plan: pantoprazole (PROTONIX) 40 MG tablet      Plan:   Janeanne was seen today for biometric physical.  Diagnoses and all orders for this visit:  Encounter for biometric screening -     Glucose, random -     Lipid Panel With LDL/HDL  Ratio  Gastroesophageal reflux disease, esophagitis presence not specified -     pantoprazole (PROTONIX) 40 MG tablet; Take 1 tablet (40 mg total) by mouth daily.   She requested refill above on Protonix , she will see her primary care for further refills.    Please see your primary care provider for a yearly physical and labs.  I will have the office call you on your glucose and cholesterol results when they return if you have not heard within 1 week  please call the office and will have you follow up with your primary care doctor for any abnormal's. This biometric physical is not a substitute for seeing a primary care provider for a physical. Provider also recommends if you do not have a primary care provider for patient see a  primary care physician/ provider  for a routine physical and to establish primary care. Patient may chose provider of choice. Also gave the [place] [place] [facility]  PHYSICIAN/PROVIDER  REFERRAL LINE at [phone] or web site at [email or web address] to help assist with finding a primary care doctor. Patient understands this office is acute care and no primary care is in this office.    Follow up with primary care as needed for chronic and maintenance health care can be seen in this employee clinic for acute care.

## 2019-02-09 NOTE — Patient Instructions (Signed)
Please see your primary care provider for a yearly physical and labs.  I will have the office call you on your glucose and cholesterol results when they return if you have not heard within 1 week please call the office and will have you follow up with your primary care doctor for any abnormal's. This biometric physical is not a substitute for seeing a primary care provider for a physical. Provider also recommends if you do not have a primary care provider for patient see a  primary care physician/ provider  for a routine physical and to establish primary care. Patient may chose provider of choice. Also gave the [facility]  PHYSICIAN/PROVIDER  REFERRAL LINE at [phone] or web site at [email or web address] to help assist with finding a primary care doctor. Patient understands this office is acute care and no primary care is in this office.   Follow up with primary care as needed for chronic and maintenance health care- can be seen in this employee clinic for acute care.    Health Maintenance, Female Adopting a healthy lifestyle and getting preventive care can go a long way to promote health and wellness. Talk with your health care provider about what schedule of regular examinations is right for you. This is a good chance for you to check in with your provider about disease prevention and staying healthy. In between checkups, there are plenty of things you can do on your own. Experts have done a lot of research about which lifestyle changes and preventive measures are most likely to keep you healthy. Ask your health care provider for more information. Weight and diet Eat a healthy diet  Be sure to include plenty of vegetables, fruits, low-fat dairy products, and lean protein.  Do not eat a lot of foods high in solid fats, added sugars, or salt.  Get regular exercise. This is one of the most important things you can do for your health. ? Most adults should exercise for at least 150 minutes each  week. The exercise should increase your heart rate and make you sweat (moderate-intensity exercise). ? Most adults should also do strengthening exercises at least twice a week. This is in addition to the moderate-intensity exercise. Maintain a healthy weight  Body mass index (BMI) is a measurement that can be used to identify possible weight problems. It estimates body fat based on height and weight. Your health care provider can help determine your BMI and help you achieve or maintain a healthy weight.  For females 20 years of age and older: ? A BMI below 18.5 is considered underweight. ? A BMI of 18.5 to 24.9 is normal. ? A BMI of 25 to 29.9 is considered overweight. ? A BMI of 30 and above is considered obese. Watch levels of cholesterol and blood lipids  You should start having your blood tested for lipids and cholesterol at 47 years of age, then have this test every 5 years.  You may need to have your cholesterol levels checked more often if: ? Your lipid or cholesterol levels are high. ? You are older than 47 years of age. ? You are at high risk for heart disease. Cancer screening Lung Cancer  Lung cancer screening is recommended for adults 55-80 years old who are at high risk for lung cancer because of a history of smoking.  A yearly low-dose CT scan of the lungs is recommended for people who: ? Currently smoke. ? Have quit within the past 15 years. ?   Have at least a 30-pack-year history of smoking. A pack year is smoking an average of one pack of cigarettes a day for 1 year.  Yearly screening should continue until it has been 15 years since you quit.  Yearly screening should stop if you develop a health problem that would prevent you from having lung cancer treatment. Breast Cancer  Practice breast self-awareness. This means understanding how your breasts normally appear and feel.  It also means doing regular breast self-exams. Let your health care provider know about any  changes, no matter how small.  If you are in your 20s or 30s, you should have a clinical breast exam (CBE) by a health care provider every 1-3 years as part of a regular health exam.  If you are 40 or older, have a CBE every year. Also consider having a breast X-ray (mammogram) every year.  If you have a family history of breast cancer, talk to your health care provider about genetic screening.  If you are at high risk for breast cancer, talk to your health care provider about having an MRI and a mammogram every year.  Breast cancer gene (BRCA) assessment is recommended for women who have family members with BRCA-related cancers. BRCA-related cancers include: ? Breast. ? Ovarian. ? Tubal. ? Peritoneal cancers.  Results of the assessment will determine the need for genetic counseling and BRCA1 and BRCA2 testing. Cervical Cancer Your health care provider may recommend that you be screened regularly for cancer of the pelvic organs (ovaries, uterus, and vagina). This screening involves a pelvic examination, including checking for microscopic changes to the surface of your cervix (Pap test). You may be encouraged to have this screening done every 3 years, beginning at age 21.  For women ages 30-65, health care providers may recommend pelvic exams and Pap testing every 3 years, or they may recommend the Pap and pelvic exam, combined with testing for human papilloma virus (HPV), every 5 years. Some types of HPV increase your risk of cervical cancer. Testing for HPV may also be done on women of any age with unclear Pap test results.  Other health care providers may not recommend any screening for nonpregnant women who are considered low risk for pelvic cancer and who do not have symptoms. Ask your health care provider if a screening pelvic exam is right for you.  If you have had past treatment for cervical cancer or a condition that could lead to cancer, you need Pap tests and screening for cancer  for at least 20 years after your treatment. If Pap tests have been discontinued, your risk factors (such as having a new sexual partner) need to be reassessed to determine if screening should resume. Some women have medical problems that increase the chance of getting cervical cancer. In these cases, your health care provider may recommend more frequent screening and Pap tests. Colorectal Cancer  This type of cancer can be detected and often prevented.  Routine colorectal cancer screening usually begins at 47 years of age and continues through 47 years of age.  Your health care provider may recommend screening at an earlier age if you have risk factors for colon cancer.  Your health care provider may also recommend using home test kits to check for hidden blood in the stool.  A small camera at the end of a tube can be used to examine your colon directly (sigmoidoscopy or colonoscopy). This is done to check for the earliest forms of colorectal cancer.  Routine   screening usually begins at age 50.  Direct examination of the colon should be repeated every 5-10 years through 47 years of age. However, you may need to be screened more often if early forms of precancerous polyps or small growths are found. Skin Cancer  Check your skin from head to toe regularly.  Tell your health care provider about any new moles or changes in moles, especially if there is a change in a mole's shape or color.  Also tell your health care provider if you have a mole that is larger than the size of a pencil eraser.  Always use sunscreen. Apply sunscreen liberally and repeatedly throughout the day.  Protect yourself by wearing long sleeves, pants, a wide-brimmed hat, and sunglasses whenever you are outside. Heart disease, diabetes, and high blood pressure  High blood pressure causes heart disease and increases the risk of stroke. High blood pressure is more likely to develop in: ? People who have blood pressure in  the high end of the normal range (130-139/85-89 mm Hg). ? People who are overweight or obese. ? People who are African American.  If you are 18-39 years of age, have your blood pressure checked every 3-5 years. If you are 40 years of age or older, have your blood pressure checked every year. You should have your blood pressure measured twice-once when you are at a hospital or clinic, and once when you are not at a hospital or clinic. Record the average of the two measurements. To check your blood pressure when you are not at a hospital or clinic, you can use: ? An automated blood pressure machine at a pharmacy. ? A home blood pressure monitor.  If you are between 55 years and 79 years old, ask your health care provider if you should take aspirin to prevent strokes.  Have regular diabetes screenings. This involves taking a blood sample to check your fasting blood sugar level. ? If you are at a normal weight and have a low risk for diabetes, have this test once every three years after 47 years of age. ? If you are overweight and have a high risk for diabetes, consider being tested at a younger age or more often. Preventing infection Hepatitis B  If you have a higher risk for hepatitis B, you should be screened for this virus. You are considered at high risk for hepatitis B if: ? You were born in a country where hepatitis B is common. Ask your health care provider which countries are considered high risk. ? Your parents were born in a high-risk country, and you have not been immunized against hepatitis B (hepatitis B vaccine). ? You have HIV or AIDS. ? You use needles to inject street drugs. ? You live with someone who has hepatitis B. ? You have had sex with someone who has hepatitis B. ? You get hemodialysis treatment. ? You take certain medicines for conditions, including cancer, organ transplantation, and autoimmune conditions. Hepatitis C  Blood testing is recommended for: ? Everyone  born from 1945 through 1965. ? Anyone with known risk factors for hepatitis C. Sexually transmitted infections (STIs)  You should be screened for sexually transmitted infections (STIs) including gonorrhea and chlamydia if: ? You are sexually active and are younger than 47 years of age. ? You are older than 47 years of age and your health care provider tells you that you are at risk for this type of infection. ? Your sexual activity has changed since you were   last screened and you are at an increased risk for chlamydia or gonorrhea. Ask your health care provider if you are at risk.  If you do not have HIV, but are at risk, it may be recommended that you take a prescription medicine daily to prevent HIV infection. This is called pre-exposure prophylaxis (PrEP). You are considered at risk if: ? You are sexually active and do not regularly use condoms or know the HIV status of your partner(s). ? You take drugs by injection. ? You are sexually active with a partner who has HIV. Talk with your health care provider about whether you are at high risk of being infected with HIV. If you choose to begin PrEP, you should first be tested for HIV. You should then be tested every 3 months for as long as you are taking PrEP. Pregnancy  If you are premenopausal and you may become pregnant, ask your health care provider about preconception counseling.  If you may become pregnant, take 400 to 800 micrograms (mcg) of folic acid every day.  If you want to prevent pregnancy, talk to your health care provider about birth control (contraception). Osteoporosis and menopause  Osteoporosis is a disease in which the bones lose minerals and strength with aging. This can result in serious bone fractures. Your risk for osteoporosis can be identified using a bone density scan.  If you are 65 years of age or older, or if you are at risk for osteoporosis and fractures, ask your health care provider if you should be  screened.  Ask your health care provider whether you should take a calcium or vitamin D supplement to lower your risk for osteoporosis.  Menopause may have certain physical symptoms and risks.  Hormone replacement therapy may reduce some of these symptoms and risks. Talk to your health care provider about whether hormone replacement therapy is right for you. Follow these instructions at home:  Schedule regular health, dental, and eye exams.  Stay current with your immunizations.  Do not use any tobacco products including cigarettes, chewing tobacco, or electronic cigarettes.  If you are pregnant, do not drink alcohol.  If you are breastfeeding, limit how much and how often you drink alcohol.  Limit alcohol intake to no more than 1 drink per day for nonpregnant women. One drink equals 12 ounces of beer, 5 ounces of wine, or 1 ounces of hard liquor.  Do not use street drugs.  Do not share needles.  Ask your health care provider for help if you need support or information about quitting drugs.  Tell your health care provider if you often feel depressed.  Tell your health care provider if you have ever been abused or do not feel safe at home. This information is not intended to replace advice given to you by your health care provider. Make sure you discuss any questions you have with your health care provider. Document Released: 06/02/2011 Document Revised: 04/24/2016 Document Reviewed: 08/21/2015 Elsevier Interactive Patient Education  2019 [employer]  

## 2019-02-10 LAB — LIPID PANEL WITH LDL/HDL RATIO
Cholesterol, Total: 212 mg/dL — ABNORMAL HIGH (ref 100–199)
HDL: 39 mg/dL — AB (ref >39)
LDL Calculated: 139 mg/dL — ABNORMAL HIGH (ref 0–99)
LDL/HDL RATIO: 3.6 ratio — AB (ref 0.0–3.2)
TRIGLYCERIDES: 171 mg/dL — AB (ref 0–149)
VLDL Cholesterol Cal: 34 mg/dL (ref 5–40)

## 2019-02-10 LAB — GLUCOSE, RANDOM: Glucose: 106 mg/dL — ABNORMAL HIGH (ref 65–99)

## 2019-02-11 ENCOUNTER — Encounter: Payer: Self-pay | Admitting: Adult Health

## 2019-02-11 NOTE — Progress Notes (Signed)
Hi Ms. Cinco,  I wanted to let you know that if you were truly fasting, your glucose is elevated at 106, normal ranges 65-99. I recommend a Hemoglobin A1c with your primary care. Your total cholesterol is 212 this is elevated as normal range is  100-199  Your LDL ( Bad cholesterol)is elevated at 139, normal is  less than 99.  Your triglycerides are elevated at 171, normal ranges 0-149.  I recommend a low-cholesterol low-fat low glucose diet and moderate exercise at least 30 minutes/day at least 5 days a week.  I will include patient teaching in your MyChart message.  I recommend you follow-up with your primary care physician Dr. Sullivan Lone for a full physical /female health maintenance and to discuss labs/medications as recommended at your biometric screening.  I have included some healthy tips below  (in my chart)and please feel free to reach out to me if you should have any questions at [phone].   Thanks, Marvell Fuller MSN, AGNP-C, FNP-C

## 2019-03-23 NOTE — Addendum Note (Signed)
Addended by: Berniece Pap on: 03/23/2019 11:46 AM   Modules accepted: Level of Service

## 2019-05-11 ENCOUNTER — Ambulatory Visit (INDEPENDENT_AMBULATORY_CARE_PROVIDER_SITE_OTHER): Payer: 59

## 2019-05-11 ENCOUNTER — Encounter: Payer: Self-pay | Admitting: Podiatry

## 2019-05-11 ENCOUNTER — Other Ambulatory Visit: Payer: Self-pay

## 2019-05-11 ENCOUNTER — Ambulatory Visit: Payer: 59 | Admitting: Podiatry

## 2019-05-11 VITALS — Temp 97.9°F

## 2019-05-11 DIAGNOSIS — M7752 Other enthesopathy of left foot: Secondary | ICD-10-CM

## 2019-05-11 DIAGNOSIS — M2142 Flat foot [pes planus] (acquired), left foot: Secondary | ICD-10-CM

## 2019-05-11 DIAGNOSIS — M779 Enthesopathy, unspecified: Secondary | ICD-10-CM

## 2019-05-11 DIAGNOSIS — M76829 Posterior tibial tendinitis, unspecified leg: Secondary | ICD-10-CM | POA: Diagnosis not present

## 2019-05-11 DIAGNOSIS — M722 Plantar fascial fibromatosis: Secondary | ICD-10-CM | POA: Diagnosis not present

## 2019-05-11 DIAGNOSIS — M778 Other enthesopathies, not elsewhere classified: Secondary | ICD-10-CM

## 2019-05-11 DIAGNOSIS — M2141 Flat foot [pes planus] (acquired), right foot: Secondary | ICD-10-CM | POA: Diagnosis not present

## 2019-05-11 MED ORDER — MELOXICAM 15 MG PO TABS: 15.0000 mg | ORAL_TABLET | Freq: Every day | ORAL | 3 refills | Status: DC

## 2019-05-11 MED ORDER — METHYLPREDNISOLONE 4 MG PO TBPK: ORAL_TABLET | ORAL | 0 refills | Status: DC

## 2019-05-11 NOTE — Progress Notes (Signed)
She presents today chief complaint of pain to the lateral aspect of the left foot states is been aching for about a month or so.  She was started that she never came in and saw me to have her foot reevaluated.  States been going on for now for about a month denies any injury she tried Advil and BenGay nothing seems to help.  Objective: Vital signs are stable alert and oriented x3.  Pulses are palpable.  Neurologic sensorium is intact.  dT reflexes are intact.  Muscle strength is normal symmetrical.  She has pain on palpation medial calcaneal tubercle of the left heel with severe pes planus and posterior tibial tendon dysfunction left.  She has some tenderness on palpation of the fourth fifth TMT joint.  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  Plan: Discussed etiology pathology conservative versus surgical therapies.  At this point I injected the medial aspect of the left heel placed her on a Medrol Dosepak to be followed by meloxicam.  Discussed appropriate shoe gear stretching exercise ice therapy and shoe gear modifications.  We will go ahead and have her see Liliane Channel to get a deep heel cup orthotic built to help control that left foot and the severe pronation.

## 2019-05-16 ENCOUNTER — Ambulatory Visit: Payer: Self-pay | Admitting: Family Medicine

## 2019-05-17 ENCOUNTER — Other Ambulatory Visit (HOSPITAL_COMMUNITY)
Admission: RE | Admit: 2019-05-17 | Discharge: 2019-05-17 | Disposition: A | Discharge status: Home / Self Care | Payer: Managed Care, Other (non HMO) | Source: Ambulatory Visit | Attending: Family Medicine | Admitting: Family Medicine

## 2019-05-17 ENCOUNTER — Ambulatory Visit: Payer: Managed Care, Other (non HMO) | Admitting: Family Medicine

## 2019-05-17 ENCOUNTER — Encounter: Payer: Self-pay | Admitting: Family Medicine

## 2019-05-17 ENCOUNTER — Other Ambulatory Visit: Payer: Self-pay

## 2019-05-17 VITALS — BP 135/92 | HR 71 | Temp 98.5°F | Wt 186.0 lb

## 2019-05-17 DIAGNOSIS — Z124 Encounter for screening for malignant neoplasm of cervix: Secondary | ICD-10-CM

## 2019-05-17 DIAGNOSIS — R232 Flushing: Secondary | ICD-10-CM

## 2019-05-17 DIAGNOSIS — K219 Gastro-esophageal reflux disease without esophagitis: Secondary | ICD-10-CM

## 2019-05-17 DIAGNOSIS — R739 Hyperglycemia, unspecified: Secondary | ICD-10-CM

## 2019-05-17 DIAGNOSIS — Z1211 Encounter for screening for malignant neoplasm of colon: Secondary | ICD-10-CM | POA: Diagnosis not present

## 2019-05-17 DIAGNOSIS — Z1239 Encounter for other screening for malignant neoplasm of breast: Secondary | ICD-10-CM

## 2019-05-17 DIAGNOSIS — Z0000 Encounter for general adult medical examination without abnormal findings: Secondary | ICD-10-CM

## 2019-05-17 DIAGNOSIS — N926 Irregular menstruation, unspecified: Secondary | ICD-10-CM | POA: Diagnosis not present

## 2019-05-17 LAB — IFOBT (OCCULT BLOOD): IFOBT: NEGATIVE

## 2019-05-17 MED ORDER — PANTOPRAZOLE SODIUM 40 MG PO TBEC: 40.0000 mg | DELAYED_RELEASE_TABLET | Freq: Every day | ORAL | 0 refills | Status: DC

## 2019-05-17 NOTE — Progress Notes (Signed)
Patient: Eileen Hooper Female    DOB: 07-Mar-1971   48 y.o.   MRN: [id number] Visit Date: 05/17/2019  Today's Provider: Wilhemena Durie, MD   Chief Complaint  Patient presents with  . Menstrual Problem   Subjective:     HPI  CPE today. Patient comes in office today with concerns of amenorrhea for the past 6 months. Patient states that menstrual cycle has been irregular over the past 6 months and states that she has been having episodes of vaginal spotting and abdominal cramping associated.There is no menstrual flow. she has a Mirena IUD per Dr Marcelline Mates. No Known Allergies   Current Outpatient Medications:  .  levothyroxine (SYNTHROID) 50 MCG tablet, Take 50 mcg by mouth daily before breakfast., Disp: , Rfl:  .  pantoprazole (PROTONIX) 40 MG tablet, Take 1 tablet (40 mg total) by mouth daily., Disp: 90 tablet, Rfl: 0 .  levonorgestrel (MIRENA) 20 MCG/24HR IUD, 1 each by Intrauterine route once., Disp: , Rfl:  .  meloxicam (MOBIC) 15 MG tablet, Take 1 tablet (15 mg total) by mouth daily. (Patient not taking: Reported on 05/17/2019), Disp: 30 tablet, Rfl: 3 .  methylPREDNISolone (MEDROL DOSEPAK) 4 MG TBPK tablet, 6 day dose pack - take as directed (Patient not taking: Reported on 05/17/2019), Disp: 21 tablet, Rfl: 0  Review of Systems  Constitutional: Negative.   HENT: Negative.   Eyes: Negative.   Respiratory: Negative.   Cardiovascular: Negative.   Gastrointestinal: Negative.  Negative for abdominal distention and abdominal pain.  Endocrine: Negative.   Genitourinary: Positive for menstrual problem and pelvic pain. Negative for decreased urine volume, dysuria, flank pain, frequency, urgency, vaginal bleeding, vaginal discharge and vaginal pain.  Musculoskeletal: Negative.   Allergic/Immunologic: Negative.   Neurological: Negative.   Psychiatric/Behavioral: Negative.     Social History   Tobacco Use  . Smoking status: Never Smoker  . Smokeless tobacco: Never Used   Substance Use Topics  . Alcohol use: Yes    Alcohol/week: 0.0 standard drinks    Comment: socially      Objective:   BP (!) 135/92   Pulse 71   Temp 98.5 F (36.9 C) (Oral)   Wt 186 lb (84.4 kg)   BMI 30.02 kg/m  Vitals:   05/17/19 1438  BP: (!) 135/92  Pulse: 71  Temp: 98.5 F (36.9 C)  TempSrc: Oral  Weight: 186 lb (84.4 kg)     Physical Exam Vitals signs and nursing note reviewed. Exam conducted with a chaperone present.  Constitutional:      Appearance: Normal appearance.  HENT:     Head: Normocephalic and atraumatic.     Right Ear: External ear normal.     Left Ear: External ear normal.     Nose: Nose normal.  Eyes:     General: No scleral icterus.    Conjunctiva/sclera: Conjunctivae normal.  Cardiovascular:     Rate and Rhythm: Normal rate and regular rhythm.     Pulses: Normal pulses.     Heart sounds: Normal heart sounds.  Pulmonary:     Effort: Pulmonary effort is normal.     Breath sounds: Normal breath sounds.  Abdominal:     Palpations: Abdomen is soft.  Genitourinary:    General: Normal vulva.     Vagina: No vaginal discharge.     Rectum: Normal. Guaiac result negative.     Comments: String in place.Bimanual exam benign. Musculoskeletal:     Right lower leg:  No edema.     Left lower leg: No edema.  Lymphadenopathy:     Cervical: No cervical adenopathy.  Skin:    General: Skin is warm and dry.  Neurological:     General: No focal deficit present.     Mental Status: She is alert and oriented to person, place, and time. Mental status is at baseline.  Psychiatric:        Mood and Affect: Mood normal.        Behavior: Behavior normal.        Thought Content: Thought content normal.        Judgment: Judgment normal.         Assessment & Plan    1. Annual physical exam Diet and exercise discussed. - CBC with Differential/Platelet - Comprehensive metabolic panel - Lipid panel  2. Irregular menstrual bleeding   3. Hot flashes   - Follicle stimulating hormone - Luteinizing hormone - TSH  4. Cervical cancer screening  - Cytology - PAP  5. Hyperglycemia  - Hemoglobin A1c  6. Breast cancer screening  - MM Digital Screening; Future - MM Digital Screening  7. Gastroesophageal reflux disease, esophagitis presence not specified  - pantoprazole (PROTONIX) 40 MG tablet; Take 1 tablet (40 mg total) by mouth daily.  Dispense: 90 tablet; Refill: 0  8. Screen for colon cancer  - IFOBT POC (occult bld, rslt in office); Future - IFOBT POC (occult bld, rslt in office) 9.Contraception IUD with Dr Valentino Saxonherry. Refer back for any issues.     Richard Wendelyn BreslowGilbert Jr, MD  [facility] Leo RodI,Kathleen J Wolford,acting as a scribe for Megan Mansichard Gilbert Jr, MD.,have documented all relevant documentation on the behalf of Megan MansRichard Gilbert Jr, MD,as directed by  Megan Mansichard Gilbert Jr, MD while in the presence of Megan Mansichard Gilbert Jr, MD.

## 2019-05-18 ENCOUNTER — Ambulatory Visit (INDEPENDENT_AMBULATORY_CARE_PROVIDER_SITE_OTHER): Payer: 59 | Admitting: Orthotics

## 2019-05-18 DIAGNOSIS — M2141 Flat foot [pes planus] (acquired), right foot: Secondary | ICD-10-CM

## 2019-05-18 DIAGNOSIS — M722 Plantar fascial fibromatosis: Secondary | ICD-10-CM | POA: Diagnosis not present

## 2019-05-18 DIAGNOSIS — M76829 Posterior tibial tendinitis, unspecified leg: Secondary | ICD-10-CM

## 2019-05-18 DIAGNOSIS — M2012 Hallux valgus (acquired), left foot: Secondary | ICD-10-CM

## 2019-05-18 DIAGNOSIS — M2142 Flat foot [pes planus] (acquired), left foot: Secondary | ICD-10-CM

## 2019-05-18 NOTE — Progress Notes (Signed)

## 2019-05-19 LAB — CYTOLOGY - PAP: Diagnosis: NEGATIVE

## 2019-05-23 ENCOUNTER — Telehealth: Payer: Self-pay

## 2019-05-23 NOTE — Telephone Encounter (Signed)
-----   Message from Jerrol Banana., MD sent at 05/20/2019  8:58 AM EDT ----- Pap normal.

## 2019-05-23 NOTE — Telephone Encounter (Signed)
Patient was advised.  

## 2019-06-02 ENCOUNTER — Other Ambulatory Visit: Payer: Self-pay

## 2019-06-02 ENCOUNTER — Other Ambulatory Visit: Payer: Managed Care, Other (non HMO)

## 2019-06-02 DIAGNOSIS — R232 Flushing: Secondary | ICD-10-CM | POA: Diagnosis not present

## 2019-06-02 DIAGNOSIS — R7301 Impaired fasting glucose: Secondary | ICD-10-CM

## 2019-06-02 DIAGNOSIS — Z0000 Encounter for general adult medical examination without abnormal findings: Secondary | ICD-10-CM

## 2019-06-03 LAB — CBC WITH DIFFERENTIAL/PLATELET
Basophils Absolute: 0.1 10*3/uL (ref 0.0–0.2)
Basos: 1 %
EOS (ABSOLUTE): 0.4 10*3/uL (ref 0.0–0.4)
Eos: 5 %
Hematocrit: 40.2 % (ref 34.0–46.6)
Hemoglobin: 13.1 g/dL (ref 11.1–15.9)
Immature Grans (Abs): 0 10*3/uL (ref 0.0–0.1)
Immature Granulocytes: 0 %
Lymphocytes Absolute: 2.2 10*3/uL (ref 0.7–3.1)
Lymphs: 28 %
MCH: 29.6 pg (ref 26.6–33.0)
MCHC: 32.6 g/dL (ref 31.5–35.7)
MCV: 91 fL (ref 79–97)
Monocytes Absolute: 0.4 10*3/uL (ref 0.1–0.9)
Monocytes: 5 %
Neutrophils Absolute: 4.9 10*3/uL (ref 1.4–7.0)
Neutrophils: 61 %
Platelets: 222 10*3/uL (ref 150–450)
RBC: 4.42 x10E6/uL (ref 3.77–5.28)
RDW: 13.1 % (ref 11.7–15.4)
WBC: 7.9 10*3/uL (ref 3.4–10.8)

## 2019-06-03 LAB — FSH/LH
FSH: 1.8 m[IU]/mL
LH: 5.8 m[IU]/mL

## 2019-06-03 LAB — COMPREHENSIVE METABOLIC PANEL
ALT: 24 IU/L (ref 0–32)
AST: 19 IU/L (ref 0–40)
Albumin/Globulin Ratio: 2 (ref 1.2–2.2)
Albumin: 4.1 g/dL (ref 3.8–4.8)
Alkaline Phosphatase: 44 IU/L (ref 39–117)
BUN/Creatinine Ratio: 15 (ref 9–23)
BUN: 11 mg/dL (ref 6–24)
Bilirubin Total: 0.3 mg/dL (ref 0.0–1.2)
CO2: 23 mmol/L (ref 20–29)
Calcium: 9.2 mg/dL (ref 8.7–10.2)
Chloride: 98 mmol/L (ref 96–106)
Creatinine, Ser: 0.73 mg/dL (ref 0.57–1.00)
GFR calc Af Amer: 114 mL/min/{1.73_m2} (ref >59)
GFR calc non Af Amer: 99 mL/min/{1.73_m2} (ref >59)
Globulin, Total: 2.1 g/dL (ref 1.5–4.5)
Glucose: 98 mg/dL (ref 65–99)
Potassium: 4.3 mmol/L (ref 3.5–5.2)
Sodium: 136 mmol/L (ref 134–144)
Total Protein: 6.2 g/dL (ref 6.0–8.5)

## 2019-06-03 LAB — LIPID PANEL
Chol/HDL Ratio: 4.1 ratio (ref 0.0–4.4)
Cholesterol, Total: 179 mg/dL (ref 100–199)
HDL: 44 mg/dL (ref >39)
LDL Calculated: 104 mg/dL — ABNORMAL HIGH (ref 0–99)
Triglycerides: 154 mg/dL — ABNORMAL HIGH (ref 0–149)
VLDL Cholesterol Cal: 31 mg/dL (ref 5–40)

## 2019-06-03 LAB — HGB A1C W/O EAG: Hgb A1c MFr Bld: 5.8 % — ABNORMAL HIGH (ref 4.8–5.6)

## 2019-06-03 LAB — TSH: TSH: 3.93 u[IU]/mL (ref 0.450–4.500)

## 2019-06-08 ENCOUNTER — Other Ambulatory Visit: Payer: 59 | Admitting: Orthotics

## 2019-06-22 ENCOUNTER — Other Ambulatory Visit: Payer: 59 | Admitting: Orthotics

## 2019-08-09 ENCOUNTER — Other Ambulatory Visit: Payer: Self-pay | Admitting: Family Medicine

## 2019-08-09 DIAGNOSIS — K219 Gastro-esophageal reflux disease without esophagitis: Secondary | ICD-10-CM

## 2019-11-14 ENCOUNTER — Other Ambulatory Visit: Payer: Self-pay | Admitting: Family Medicine

## 2019-11-14 DIAGNOSIS — K219 Gastro-esophageal reflux disease without esophagitis: Secondary | ICD-10-CM

## 2019-12-21 ENCOUNTER — Other Ambulatory Visit: Payer: Self-pay

## 2019-12-21 ENCOUNTER — Ambulatory Visit (INDEPENDENT_AMBULATORY_CARE_PROVIDER_SITE_OTHER): Payer: Managed Care, Other (non HMO) | Admitting: Podiatry

## 2019-12-21 ENCOUNTER — Ambulatory Visit (INDEPENDENT_AMBULATORY_CARE_PROVIDER_SITE_OTHER): Payer: Managed Care, Other (non HMO)

## 2019-12-21 ENCOUNTER — Encounter: Payer: Self-pay | Admitting: Podiatry

## 2019-12-21 DIAGNOSIS — M7750 Other enthesopathy of unspecified foot: Secondary | ICD-10-CM

## 2019-12-21 DIAGNOSIS — M7751 Other enthesopathy of right foot: Secondary | ICD-10-CM

## 2019-12-21 DIAGNOSIS — M19079 Primary osteoarthritis, unspecified ankle and foot: Secondary | ICD-10-CM

## 2019-12-21 MED ORDER — METHYLPREDNISOLONE 4 MG PO TBPK: ORAL_TABLET | ORAL | 0 refills | Status: DC

## 2019-12-21 MED ORDER — INDOMETHACIN 50 MG PO CAPS: 50.0000 mg | ORAL_CAPSULE | Freq: Two times a day (BID) | ORAL | 2 refills | Status: DC

## 2019-12-21 NOTE — Progress Notes (Signed)
She presents today with a chief complaint of pain to the lateral and medial aspect of the right ankle over that of the left.  States that she still has pain in the plantar fascial areas bilaterally her feet just hurt all the time.  States that she went walking over the weekend through the woods hiking at a slow pace never rolling her ankle never twisting her foot that she can recall.  The evening of the second day she was trying to go to sleep and her ankle and foot was so swollen is so painful that she could not even rest.  She denies any new changes in her past medical history medications allergies surgery social history states that the only thing she takes is medication for her reflux.  ROS: Denies fever chills nausea vomiting muscle aches pains calf pain back pain chest pain shortness of breath.  Allergies: None.  Objective: Vital signs are stable she is alert oriented x3 she does pain on palpation medial calcaneal tubercles but not as severe as it has been in the past she also has pain on dorsiflexion of the right foot and ankle as compared to the left.  She has pain on palpation of the fibula malleolus itself and some tenderness on the peroneal tendons.  However the medial aspect of her ankle is red hot and swollen very gout-like.  No signs of infection.  Radiographs do not demonstrate any type of osseous abnormalities at all.  Assessment capsulitis cannot rule out seropositive arthropathy right over left history of chronic proximal plantar fasciitis bilateral.  Plan: Discussed etiology pathology and surgical therapies at this point I will go to request arthritic profile and CBC also requesting prescription for methylprednisolone as well as indomethacin.  Put her in compression anklet's number follow-up with her in 2 weeks

## 2019-12-21 NOTE — Patient Instructions (Signed)
RICE Therapy for Routine Care of Injuries Many injuries can be cared for with rest, ice, compression, and elevation (RICE therapy). This includes:  Resting the injured part.  Putting ice on the injury.  Putting pressure (compression) on the injury.  Raising the injured part (elevation). Using RICE therapy can help to lessen pain and swelling. Supplies needed:  Ice.  Plastic bag.  Towel.  Elastic bandage.  Pillow or pillows to raise (elevate) your injured body part. How to care for your injury with RICE therapy Rest Limit your normal activities, and try not to use the injured part of your body. You can go back to your normal activities when your doctor says it is okay to do them and you feel okay. Ask your doctor if you should do exercises to help your injury get better. Ice Put ice on the injured area. Do not put ice on your bare skin.  Put ice in a plastic bag.  Place a towel between your skin and the bag.  Leave the ice on for 20 minutes, 2-3 times a day. Use ice on as many days as told by your doctor.  Compression Compression means putting pressure on the injured area. This can be done with an elastic bandage. If an elastic bandage has been put on your injury:  Do not wrap the bandage too tight. Wrap the bandage more loosely if part of your body away from the bandage is blue, swollen, cold, painful, or loses feeling (gets numb).  Take off the bandage and put it on again. Do this every 3-4 hours or as told by your doctor.  See your doctor if the bandage seems to make your problems worse.  Elevation Elevation means keeping the injured area raised. If you can, raise the injured area above your heart or the center of your chest. Contact a doctor if:  You keep having pain and swelling.  Your symptoms get worse. Get help right away if:  You have sudden bad pain at your injury or lower than your injury.  You have redness or more swelling around your injury.  You  have tingling or numbness at your injury or lower than your injury, and it does not go away when you take off the bandage. Summary  Many injuries can be cared for using rest, ice, compression, and elevation (RICE therapy).  You can go back to your normal activities when you feel okay and your doctor says it is okay.  Put ice on the injured area as told by your doctor.  Get help if your symptoms get worse or if you keep having pain and swelling. This information is not intended to replace advice given to you by your health care provider. Make sure you discuss any questions you have with your health care provider. Document Revised: 08/07/2017 Document Reviewed: 08/07/2017 Elsevier Patient Education  2020 Elsevier Inc.  

## 2019-12-22 LAB — RHEUMATOID FACTOR: Rheumatoid fact SerPl-aCnc: <10 IU/mL (ref 0.0–13.9)

## 2019-12-22 LAB — CBC WITH DIFFERENTIAL/PLATELET
Basophils Absolute: 0.1 10*3/uL (ref 0.0–0.2)
Basos: 1 %
EOS (ABSOLUTE): 0.6 10*3/uL — ABNORMAL HIGH (ref 0.0–0.4)
Eos: 7 %
Hematocrit: 39.5 % (ref 34.0–46.6)
Hemoglobin: 13.3 g/dL (ref 11.1–15.9)
Immature Grans (Abs): 0 10*3/uL (ref 0.0–0.1)
Immature Granulocytes: 1 %
Lymphocytes Absolute: 2.1 10*3/uL (ref 0.7–3.1)
Lymphs: 27 %
MCH: 30.2 pg (ref 26.6–33.0)
MCHC: 33.7 g/dL (ref 31.5–35.7)
MCV: 90 fL (ref 79–97)
Monocytes Absolute: 0.5 10*3/uL (ref 0.1–0.9)
Monocytes: 7 %
Neutrophils Absolute: 4.5 10*3/uL (ref 1.4–7.0)
Neutrophils: 57 %
Platelets: 268 10*3/uL (ref 150–450)
RBC: 4.41 x10E6/uL (ref 3.77–5.28)
RDW: 13.1 % (ref 11.7–15.4)
WBC: 7.8 10*3/uL (ref 3.4–10.8)

## 2019-12-22 LAB — ANA: Anti Nuclear Antibody (ANA): NEGATIVE

## 2019-12-22 LAB — URIC ACID: Uric Acid: 6.7 mg/dL — ABNORMAL HIGH (ref 2.6–6.2)

## 2019-12-22 LAB — C-REACTIVE PROTEIN: CRP: 15 mg/L — ABNORMAL HIGH (ref 0–10)

## 2019-12-22 LAB — SEDIMENTATION RATE: Sed Rate: 24 mm/hr (ref 0–32)

## 2020-01-04 ENCOUNTER — Other Ambulatory Visit: Payer: Self-pay

## 2020-01-04 ENCOUNTER — Encounter: Payer: Self-pay | Admitting: Podiatry

## 2020-01-04 ENCOUNTER — Ambulatory Visit: Payer: Managed Care, Other (non HMO) | Admitting: Podiatry

## 2020-01-04 DIAGNOSIS — S86312A Strain of muscle(s) and tendon(s) of peroneal muscle group at lower leg level, left leg, initial encounter: Secondary | ICD-10-CM | POA: Diagnosis not present

## 2020-01-04 NOTE — Progress Notes (Signed)
Subjective:  Patient ID: Eileen Hooper, female    DOB: [birth year],  MRN: [id number] HPI Chief Complaint  Patient presents with  . Plantar Fasciitis    "they are about 70% better.  I still have pains on the outside of my ankles and now I have sharp pains that shoot into my left big toe for 3 days.  That pain comes and goes"    49 y.o. female presents with the above complaint.  She presents today states that her right foot is about 70% better than what it was but both feet still hurt about the same left being the initiating problem.  LE still not walking on the walker at this point.  ROS: Denies fever chills nausea vomiting muscle aches pains calf pain back pain chest pain shortness of breath.  Past Medical History:  Diagnosis Date  . GERD (gastroesophageal reflux disease)   . Thyroid disease    Past Surgical History:  Procedure Laterality Date  . NO PAST SURGERIES      Current Outpatient Medications:  .  indomethacin (INDOCIN) 50 MG capsule, Take 1 capsule (50 mg total) by mouth 2 (two) times daily with a meal., Disp: 60 capsule, Rfl: 2 .  levonorgestrel (MIRENA) 20 MCG/24HR IUD, 1 each by Intrauterine route once., Disp: , Rfl:  .  levothyroxine (SYNTHROID) 50 MCG tablet, Take 50 mcg by mouth daily before breakfast., Disp: , Rfl:  .  methylPREDNISolone (MEDROL DOSEPAK) 4 MG TBPK tablet, 6 day dose pack - take as directed, Disp: 21 tablet, Rfl: 0 .  pantoprazole (PROTONIX) 40 MG tablet, TAKE 1 TABLET BY MOUTH EVERY DAY, Disp: 90 tablet, Rfl: 0  No Known Allergies Review of Systems Objective:  There were no vitals filed for this visit.  General: Well developed, nourished, in no acute distress, alert and oriented x3   Dermatological: Skin is warm, dry and supple bilateral. Nails x 10 are well maintained; remaining integument appears unremarkable at this time. There are no open sores, no preulcerative lesions, no rash or signs of infection present.  Vascular: Dorsalis Pedis artery  and Posterior Tibial artery pedal pulses are 2/4 bilateral with immedate capillary fill time. Pedal hair growth present. No varicosities and no lower extremity edema present bilateral.   Neruologic: Grossly intact via light touch bilateral. Vibratory intact via tuning fork bilateral. Protective threshold with Semmes Wienstein monofilament intact to all pedal sites bilateral. Patellar and Achilles deep tendon reflexes 2+ bilateral. No Babinski or clonus noted bilateral.   Musculoskeletal: No gross boney pedal deformities bilateral. No pain, crepitus, or limitation noted with foot and ankle range of motion bilateral. Muscular strength 5/5 in all groups tested bilateral.  Still has pain on palpation of the peroneal tendon and abduction against resistance is painful left greater than that of the right considerable swelling to the lateral malleolus area of the left foot.  Gait: Unassisted, Nonantalgic.  Labs that we drew last time came back positive for an increased uric acid level.  All other labs were normal other than sed rate which corresponds.   Radiographs:  None taken today.  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  Right gouty lateral ankle capsulitis is resolving.  Plan: At this point the right foot is doing much better however the left plantar fascia and peroneal tendon is exquisitely painful.  Is affecting her ability to perform her daily activities she continues her anti-inflammatories on a regular basis she tried her cam walker and  steroid packs.  At this point I feel is necessary for an MRI to rule out a tear or any other type of chronic issue that may result in her continued pain and suffering.     Kong Packett T. [place], [place]

## 2020-01-05 ENCOUNTER — Telehealth: Payer: Self-pay

## 2020-01-05 NOTE — Telephone Encounter (Signed)
-----   Message from Kristian Covey, [facility] sent at 01/04/2020 10:20 AM EST ----- Regarding: MRI MRI left ankle - evaluate peroneal tendon tear left - surgical consideration

## 2020-01-05 NOTE — Telephone Encounter (Signed)
Office notes have been uploaded to [facility] for Prior review

## 2020-01-06 ENCOUNTER — Telehealth: Payer: Self-pay

## 2020-01-06 DIAGNOSIS — S86312A Strain of muscle(s) and tendon(s) of peroneal muscle group at lower leg level, left leg, initial encounter: Secondary | ICD-10-CM

## 2020-01-06 NOTE — Telephone Encounter (Signed)
-----   Message from Ashley E Prevette, PMAC sent at 01/04/2020 10:20 AM EST ----- Regarding: MRI MRI left ankle - evaluate peroneal tendon tear left - surgical consideration  

## 2020-01-06 NOTE — Telephone Encounter (Addendum)
MRI has been approved from 01/05/2020 to 07/03/2020 Auth# [id number]  Patient has been notified of approval and requested to go to [facility].  [employer] has been contacted and location has been changed

## 2020-02-08 ENCOUNTER — Other Ambulatory Visit: Payer: Self-pay | Admitting: Family Medicine

## 2020-02-08 DIAGNOSIS — K219 Gastro-esophageal reflux disease without esophagitis: Secondary | ICD-10-CM

## 2020-03-18 ENCOUNTER — Other Ambulatory Visit: Payer: Self-pay | Admitting: Podiatry

## 2020-03-21 ENCOUNTER — Encounter: Payer: Self-pay | Admitting: Family Medicine

## 2020-03-21 ENCOUNTER — Other Ambulatory Visit: Payer: Self-pay

## 2020-03-21 ENCOUNTER — Ambulatory Visit (INDEPENDENT_AMBULATORY_CARE_PROVIDER_SITE_OTHER): Payer: Managed Care, Other (non HMO) | Admitting: Family Medicine

## 2020-03-21 VITALS — BP 147/87 | HR 65 | Temp 96.6°F | Ht 66.0 in | Wt 194.6 lb

## 2020-03-21 DIAGNOSIS — Z6831 Body mass index (BMI) 31.0-31.9, adult: Secondary | ICD-10-CM | POA: Diagnosis not present

## 2020-03-21 DIAGNOSIS — E6609 Other obesity due to excess calories: Secondary | ICD-10-CM | POA: Diagnosis not present

## 2020-03-21 DIAGNOSIS — E038 Other specified hypothyroidism: Secondary | ICD-10-CM

## 2020-03-21 NOTE — Progress Notes (Signed)
Established patient visit I,Atiya M Cummings,acting as a scribe for Wilhemena Durie, MD.,have documented all relevant documentation on the behalf of Wilhemena Durie, MD,as directed by  Wilhemena Durie, MD while in the presence of Wilhemena Durie, MD.   Patient: Eileen Hooper   DOB: Jul 14, 1971   49 y.o. Female  MRN: [id number] Visit Date: 03/21/2020  Today's healthcare provider: Wilhemena Durie, MD   Chief Complaint  Patient presents with  . Gynecologic Exam    Only PAP   Subjective    HPI Patient presents in office today for Pap Smear. Patient's last pap smear was 05/17/2019. Patient has been having some spotting.  She has an IUD in place and knows that this is normal.  She thought her last Pap smear was 2 years ago.  It was 10 months ago so I told her we did not need to repeat the Pap smear today without any other issues.  She was very pleased with that.  Of note is that she had one episode of gout that has not recurred.  She was started on Indocin for this and she has had some mild foot pain that is getting better.  She continues to work for the [employer].  She did had Covid in December and is completely recovered.  She is uncertain about receiving Covid vaccine.  She is taking no medications.  She stopped taking her levothyroxine.      Medications: Outpatient Medications Prior to Visit  Medication Sig  . indomethacin (INDOCIN) 50 MG capsule Take 1 capsule (50 mg total) by mouth 2 (two) times daily with a meal.  . levonorgestrel (MIRENA) 20 MCG/24HR IUD 1 each by Intrauterine route once.  . pantoprazole (PROTONIX) 40 MG tablet TAKE 1 TABLET BY MOUTH EVERY DAY  . levothyroxine (SYNTHROID) 50 MCG tablet Take 50 mcg by mouth daily before breakfast.  . methylPREDNISolone (MEDROL DOSEPAK) 4 MG TBPK tablet 6 day dose pack - take as directed (Patient not taking: Reported on 03/21/2020)   No facility-administered medications prior to visit.     Review of Systems  Constitutional: Negative.   HENT: Negative.   Eyes: Negative.   Respiratory: Negative.   Cardiovascular: Negative.   Gastrointestinal: Negative.   Endocrine: Negative.   Musculoskeletal: Negative.   Allergic/Immunologic: Negative.   Neurological: Negative.   Hematological: Negative.   Psychiatric/Behavioral: Negative.     Last thyroid functions Lab Results  Component Value Date   TSH 3.930 06/02/2019   T4TOTAL 4.8 02/02/2018       Objective    BP (!) 147/87 (BP Location: Right Arm, Patient Position: Sitting, Cuff Size: Large)   Pulse 65   Temp (!) 96.6 F (35.9 C) (Temporal)   Ht 5\' 6"  (1.676 m)   Wt 194 lb 9.6 oz (88.3 kg)   SpO2 98%   BMI 31.41 kg/m  BP Readings from Last 3 Encounters:  03/21/20 (!) 147/87  05/17/19 (!) 135/92  02/09/19 124/80   Wt Readings from Last 3 Encounters:  03/21/20 194 lb 9.6 oz (88.3 kg)  05/17/19 186 lb (84.4 kg)  02/09/19 186 lb (84.4 kg)      Physical Exam Vitals and nursing note reviewed.  Constitutional:      Appearance: Normal appearance.  HENT:     Head: Normocephalic and atraumatic.     Right Ear: External ear normal.     Left Ear: External ear normal.     Nose: Nose normal.  Eyes:     General: No scleral icterus.    Conjunctiva/sclera: Conjunctivae normal.  Cardiovascular:     Rate and Rhythm: Normal rate and regular rhythm.     Pulses: Normal pulses.     Heart sounds: Normal heart sounds.  Pulmonary:     Effort: Pulmonary effort is normal.     Breath sounds: Normal breath sounds.  Abdominal:     Palpations: Abdomen is soft.  Musculoskeletal:     Right lower leg: No edema.     Left lower leg: No edema.  Lymphadenopathy:     Cervical: No cervical adenopathy.  Skin:    General: Skin is warm and dry.  Neurological:     General: No focal deficit present.     Mental Status: She is alert and oriented to person, place, and time. Mental status is at baseline.  Psychiatric:        Mood  and Affect: Mood normal.        Behavior: Behavior normal.        Thought Content: Thought content normal.        Judgment: Judgment normal.       No results found for any visits on 03/21/20.   Assessment & Plan     1. Other specified hypothyroidism Check TSH.  Follow-up 1 year for physical.  Diet and exercise stressed.  Have advised patient get her Covid vaccines. 2.  Mild adiposity No follow-ups on file.      I, Megan Mans, MD, have reviewed all documentation for this visit. The documentation on 03/24/20 for the exam, diagnosis, procedures, and orders are all accurate and complete.    Sundi Slevin Wendelyn Breslow, MD  [facility] [phone] (phone) [phone] (fax)  [facility]

## 2020-03-27 ENCOUNTER — Ambulatory Visit: Payer: Managed Care, Other (non HMO) | Admitting: Nurse Practitioner

## 2020-03-27 ENCOUNTER — Other Ambulatory Visit: Payer: Self-pay

## 2020-03-27 VITALS — BP 130/82 | HR 76 | Temp 97.0°F | Resp 16 | Ht 67.0 in | Wt 185.0 lb

## 2020-03-27 DIAGNOSIS — K219 Gastro-esophageal reflux disease without esophagitis: Secondary | ICD-10-CM

## 2020-03-27 DIAGNOSIS — Z008 Encounter for other general examination: Secondary | ICD-10-CM

## 2020-03-27 MED ORDER — PANTOPRAZOLE SODIUM 40 MG PO TBEC: 40.0000 mg | DELAYED_RELEASE_TABLET | Freq: Every day | ORAL | 0 refills | Status: DC

## 2020-03-27 NOTE — Patient Instructions (Signed)
Health Maintenance, Female Adopting a healthy lifestyle and getting preventive care are important in promoting health and wellness. Ask your health care provider about:  The right schedule for you to have regular tests and exams.  Things you can do on your own to prevent diseases and keep yourself healthy. What should I know about diet, weight, and exercise? Eat a healthy diet   Eat a diet that includes plenty of vegetables, fruits, low-fat dairy products, and lean protein.  Do not eat a lot of foods that are high in solid fats, added sugars, or sodium. Maintain a healthy weight Body mass index (BMI) is used to identify weight problems. It estimates body fat based on height and weight. Your health care provider can help determine your BMI and help you achieve or maintain a healthy weight. Get regular exercise Get regular exercise. This is one of the most important things you can do for your health. Most adults should:  Exercise for at least 150 minutes each week. The exercise should increase your heart rate and make you sweat (moderate-intensity exercise).  Do strengthening exercises at least twice a week. This is in addition to the moderate-intensity exercise.  Spend less time sitting. Even light physical activity can be beneficial. Watch cholesterol and blood lipids Have your blood tested for lipids and cholesterol at 48 years of age, then have this test every 5 years. Have your cholesterol levels checked more often if:  Your lipid or cholesterol levels are high.  You are older than 48 years of age.  You are at high risk for heart disease. What should I know about cancer screening? Depending on your health history and family history, you may need to have cancer screening at various ages. This may include screening for:  Breast cancer.  Cervical cancer.  Colorectal cancer.  Skin cancer.  Lung cancer. What should I know about heart disease, diabetes, and high blood  pressure? Blood pressure and heart disease  High blood pressure causes heart disease and increases the risk of stroke. This is more likely to develop in people who have high blood pressure readings, are of African descent, or are overweight.  Have your blood pressure checked: ? Every 3-5 years if you are 18-39 years of age. ? Every year if you are 40 years old or older. Diabetes Have regular diabetes screenings. This checks your fasting blood sugar level. Have the screening done:  Once every three years after age 40 if you are at a normal weight and have a low risk for diabetes.  More often and at a younger age if you are overweight or have a high risk for diabetes. What should I know about preventing infection? Hepatitis B If you have a higher risk for hepatitis B, you should be screened for this virus. Talk with your health care provider to find out if you are at risk for hepatitis B infection. Hepatitis C Testing is recommended for:  Everyone born from 1945 through 1965.  Anyone with known risk factors for hepatitis C. Sexually transmitted infections (STIs)  Get screened for STIs, including gonorrhea and chlamydia, if: ? You are sexually active and are younger than 48 years of age. ? You are older than 48 years of age and your health care provider tells you that you are at risk for this type of infection. ? Your sexual activity has changed since you were last screened, and you are at increased risk for chlamydia or gonorrhea. Ask your health care provider if   you are at risk.  Ask your health care provider about whether you are at high risk for HIV. Your health care provider may recommend a prescription medicine to help prevent HIV infection. If you choose to take medicine to prevent HIV, you should first get tested for HIV. You should then be tested every 3 months for as long as you are taking the medicine. Pregnancy  If you are about to stop having your period (premenopausal) and  you may become pregnant, seek counseling before you get pregnant.  Take 400 to 800 micrograms (mcg) of folic acid every day if you become pregnant.  Ask for birth control (contraception) if you want to prevent pregnancy. Osteoporosis and menopause Osteoporosis is a disease in which the bones lose minerals and strength with aging. This can result in bone fractures. If you are 65 years old or older, or if you are at risk for osteoporosis and fractures, ask your health care provider if you should:  Be screened for bone loss.  Take a calcium or vitamin D supplement to lower your risk of fractures.  Be given hormone replacement therapy (HRT) to treat symptoms of menopause. Follow these instructions at home: Lifestyle  Do not use any products that contain nicotine or tobacco, such as cigarettes, e-cigarettes, and chewing tobacco. If you need help quitting, ask your health care provider.  Do not use street drugs.  Do not share needles.  Ask your health care provider for help if you need support or information about quitting drugs. Alcohol use  Do not drink alcohol if: ? Your health care provider tells you not to drink. ? You are pregnant, may be pregnant, or are planning to become pregnant.  If you drink alcohol: ? Limit how much you use to 0-1 drink a day. ? Limit intake if you are breastfeeding.  Be aware of how much alcohol is in your drink. In the [place] one drink equals one 12 oz bottle of beer (355 mL), one 5 oz glass of wine (148 mL), or one 1 oz glass of hard liquor (44 mL). General instructions  Schedule regular health, dental, and eye exams.  Stay current with your vaccines.  Tell your health care provider if: ? You often feel depressed. ? You have ever been abused or do not feel safe at home. Summary  Adopting a healthy lifestyle and getting preventive care are important in promoting health and wellness.  Follow your health care provider's instructions about healthy  diet, exercising, and getting tested or screened for diseases.  Follow your health care provider's instructions on monitoring your cholesterol and blood pressure. This information is not intended to replace advice given to you by your health care provider. Make sure you discuss any questions you have with your health care provider. Document Revised: 11/10/2018 Document Reviewed: 11/10/2018 Elsevier Patient Education  2020 Elsevier Inc.  Health Maintenance, Female Adopting a healthy lifestyle and getting preventive care are important in promoting health and wellness. Ask your health care provider about:  The right schedule for you to have regular tests and exams.  Things you can do on your own to prevent diseases and keep yourself healthy. What should I know about diet, weight, and exercise? Eat a healthy diet   Eat a diet that includes plenty of vegetables, fruits, low-fat dairy products, and lean protein.  Do not eat a lot of foods that are high in solid fats, added sugars, or sodium. Maintain a healthy weight Body mass index (BMI) is   used to identify weight problems. It estimates body fat based on height and weight. Your health care provider can help determine your BMI and help you achieve or maintain a healthy weight. Get regular exercise Get regular exercise. This is one of the most important things you can do for your health. Most adults should:  Exercise for at least 150 minutes each week. The exercise should increase your heart rate and make you sweat (moderate-intensity exercise).  Do strengthening exercises at least twice a week. This is in addition to the moderate-intensity exercise.  Spend less time sitting. Even light physical activity can be beneficial. Watch cholesterol and blood lipids Have your blood tested for lipids and cholesterol at 48 years of age, then have this test every 5 years. Have your cholesterol levels checked more often if:  Your lipid or cholesterol  levels are high.  You are older than 48 years of age.  You are at high risk for heart disease. What should I know about cancer screening? Depending on your health history and family history, you may need to have cancer screening at various ages. This may include screening for:  Breast cancer.  Cervical cancer.  Colorectal cancer.  Skin cancer.  Lung cancer. What should I know about heart disease, diabetes, and high blood pressure? Blood pressure and heart disease  High blood pressure causes heart disease and increases the risk of stroke. This is more likely to develop in people who have high blood pressure readings, are of African descent, or are overweight.  Have your blood pressure checked: ? Every 3-5 years if you are 18-39 years of age. ? Every year if you are 40 years old or older. Diabetes Have regular diabetes screenings. This checks your fasting blood sugar level. Have the screening done:  Once every three years after age 40 if you are at a normal weight and have a low risk for diabetes.  More often and at a younger age if you are overweight or have a high risk for diabetes. What should I know about preventing infection? Hepatitis B If you have a higher risk for hepatitis B, you should be screened for this virus. Talk with your health care provider to find out if you are at risk for hepatitis B infection. Hepatitis C Testing is recommended for:  Everyone born from 1945 through 1965.  Anyone with known risk factors for hepatitis C. Sexually transmitted infections (STIs)  Get screened for STIs, including gonorrhea and chlamydia, if: ? You are sexually active and are younger than 48 years of age. ? You are older than 48 years of age and your health care provider tells you that you are at risk for this type of infection. ? Your sexual activity has changed since you were last screened, and you are at increased risk for chlamydia or gonorrhea. Ask your health care  provider if you are at risk.  Ask your health care provider about whether you are at high risk for HIV. Your health care provider may recommend a prescription medicine to help prevent HIV infection. If you choose to take medicine to prevent HIV, you should first get tested for HIV. You should then be tested every 3 months for as long as you are taking the medicine. Pregnancy  If you are about to stop having your period (premenopausal) and you may become pregnant, seek counseling before you get pregnant.  Take 400 to 800 micrograms (mcg) of folic acid every day if you become pregnant.  Ask   for birth control (contraception) if you want to prevent pregnancy. Osteoporosis and menopause Osteoporosis is a disease in which the bones lose minerals and strength with aging. This can result in bone fractures. If you are 65 years old or older, or if you are at risk for osteoporosis and fractures, ask your health care provider if you should:  Be screened for bone loss.  Take a calcium or vitamin D supplement to lower your risk of fractures.  Be given hormone replacement therapy (HRT) to treat symptoms of menopause. Follow these instructions at home: Lifestyle  Do not use any products that contain nicotine or tobacco, such as cigarettes, e-cigarettes, and chewing tobacco. If you need help quitting, ask your health care provider.  Do not use street drugs.  Do not share needles.  Ask your health care provider for help if you need support or information about quitting drugs. Alcohol use  Do not drink alcohol if: ? Your health care provider tells you not to drink. ? You are pregnant, may be pregnant, or are planning to become pregnant.  If you drink alcohol: ? Limit how much you use to 0-1 drink a day. ? Limit intake if you are breastfeeding.  Be aware of how much alcohol is in your drink. In the [place] one drink equals one 12 oz bottle of beer (355 mL), one 5 oz glass of wine (148 mL), or one 1  oz glass of hard liquor (44 mL). General instructions  Schedule regular health, dental, and eye exams.  Stay current with your vaccines.  Tell your health care provider if: ? You often feel depressed. ? You have ever been abused or do not feel safe at home. Summary  Adopting a healthy lifestyle and getting preventive care are important in promoting health and wellness.  Follow your health care provider's instructions about healthy diet, exercising, and getting tested or screened for diseases.  Follow your health care provider's instructions on monitoring your cholesterol and blood pressure. This information is not intended to replace advice given to you by your health care provider. Make sure you discuss any questions you have with your health care provider. Document Revised: 11/10/2018 Document Reviewed: 11/10/2018 Elsevier Patient Education  2020 Elsevier Inc.  

## 2020-03-27 NOTE — Progress Notes (Signed)
Subjective:    Patient ID: Eileen Hooper, female    DOB: 17-Sep-1971, 49 y.o.   MRN: [id number] BP 130/82 (BP Location: Left Arm, Patient Position: Sitting, Cuff Size: Normal)   Pulse 76   Temp (!) 97 F (36.1 C) (Temporal)   Resp 16   Ht 5\' 7"  (1.702 m)   Wt 185 lb (83.9 kg)   SpO2 98%   BMI 28.98 kg/m   HPI Eileen Hooper is a very pleasant  49 y.o. female who works in the [employer]. She reports she has worked there for about 10 years and likes her job. She reports she has not had her Covid Vaccine but did have Covid a while back. She reports she is currently under the care of a podiatrist due to gout in her left foot and other bilateral foot pain and issues. Employee reports a hx of Hypothyroid but reports she stopped her medication over a year ago due to side effects and that on her last lab work her thyroid study was normal. She is followed by her PCP for general health maintenance.     Meds:  Indocin, Mirena (IUD), Protonix  Allergies: NKDA Vaccine: UTD with Hepatitis and tetanus, No Covid vaccine but had Covid.  PMH: Hypothyroid, GERD, Gout  SH: No use of ETOH, Drugs or tobacco  Review of Systems  Musculoskeletal: Positive for arthralgias, gait problem and joint swelling.  All other systems reviewed and are negative. Treated for Gout left foot and bilateral foot inflammation and pain.     Objective:   Physical Exam Constitutional:      General: She is not in acute distress.    Appearance: Normal appearance.  HENT:     Head: Normocephalic and atraumatic.     Right Ear: Tympanic membrane, ear canal and external ear normal.     Left Ear: Tympanic membrane, ear canal and external ear normal.     Nose: Nose normal.     Mouth/Throat:     Mouth: Mucous membranes are moist.     Pharynx: Oropharynx is clear.  Eyes:     Extraocular Movements: Extraocular movements intact.     Conjunctiva/sclera: Conjunctivae normal.     Pupils: Pupils are  equal, round, and reactive to light.  Neck:     Thyroid: No thyroid mass or thyroid tenderness.     Vascular: Normal carotid pulses. No carotid bruit or JVD.     Trachea: Trachea normal.  Cardiovascular:     Rate and Rhythm: Normal rate and regular rhythm.     Pulses: Normal pulses.     Heart sounds: Normal heart sounds.  Pulmonary:     Effort: Pulmonary effort is normal.     Breath sounds: Normal breath sounds and air entry.  Abdominal:     Tenderness: There is no abdominal tenderness.  Musculoskeletal:     Cervical back: Neck supple. No tenderness. No pain with movement, spinous process tenderness or muscular tenderness. Normal range of motion.  Lymphadenopathy:     Cervical: No cervical adenopathy.  Skin:    General: Skin is warm and dry.  Neurological:     Mental Status: She is alert.     Cranial Nerves: Cranial nerves are intact.     Sensory: Sensation is intact.     Motor: Motor function is intact.     Gait: Gait is intact.     Deep Tendon Reflexes:     Reflex Scores:  Bicep reflexes are 2+ on the right side and 2+ on the left side.      Brachioradialis reflexes are 2+ on the right side and 2+ on the left side.      Patellar reflexes are 2+ on the right side and 2+ on the left side.    Comments: Stands on one foot without difficulty  Psychiatric:        Mood and Affect: Mood normal.        Behavior: Behavior normal.       Assessment & Plan:  49 y.o. female presents to the clinic for biometric screening. Limited PE done, labs drawn for Glucose and Lipids. Discussed with employee importance of regular exercise and diet. She will plan to do more after her feet problem subsides. She will continue f/u with her PCP regarding her thyroid and GERD. Will refill her Protonix today. She will RTC as needed.

## 2020-03-28 LAB — LIPID PANEL
Chol/HDL Ratio: 4.7 ratio — ABNORMAL HIGH (ref 0.0–4.4)
Cholesterol, Total: 189 mg/dL (ref 100–199)
HDL: 40 mg/dL (ref >39)
LDL Chol Calc (NIH): 114 mg/dL — ABNORMAL HIGH (ref 0–99)
Triglycerides: 201 mg/dL — ABNORMAL HIGH (ref 0–149)
VLDL Cholesterol Cal: 35 mg/dL (ref 5–40)

## 2020-03-28 LAB — GLUCOSE, RANDOM: Glucose: 111 mg/dL — ABNORMAL HIGH (ref 65–99)

## 2020-04-14 ENCOUNTER — Other Ambulatory Visit: Payer: Self-pay | Admitting: Podiatry

## 2020-07-23 ENCOUNTER — Other Ambulatory Visit: Payer: Self-pay | Admitting: Family Medicine

## 2020-07-23 DIAGNOSIS — K219 Gastro-esophageal reflux disease without esophagitis: Secondary | ICD-10-CM

## 2020-07-23 MED ORDER — PANTOPRAZOLE SODIUM 40 MG PO TBEC: 40.0000 mg | DELAYED_RELEASE_TABLET | Freq: Every day | ORAL | 0 refills | Status: DC

## 2020-07-23 NOTE — Telephone Encounter (Signed)
Requested medication (s) are due for refill today: yes  Requested medication (s) are on the active medication list: yes  Last refill:  03/27/20  #90  0 refills  Future visit scheduled: yes  Notes to clinic:  Last ordered by historic provider Damian Leavell NP    Requested Prescriptions  Pending Prescriptions Disp Refills   pantoprazole (PROTONIX) 40 MG tablet 90 tablet 0    Sig: Take 1 tablet (40 mg total) by mouth daily.      Gastroenterology: Proton Pump Inhibitors Passed - 07/23/2020 11:11 AM      Passed - Valid encounter within last 12 months    Recent Outpatient Visits           4 months ago Other specified hypothyroidism   [facility] Maple Hudson., MD   1 year ago Annual physical exam   [facility] Maple Hudson., MD   3 years ago Annual physical exam   [facility] Maple Hudson., MD   3 years ago Gastroesophageal reflux disease, esophagitis presence not specified   [facility] Maple Hudson., MD   4 years ago Gastroesophageal reflux disease, esophagitis presence not specified   [facility] Maple Hudson., MD       Future Appointments             In 8 months Maple Hudson., MD [facility]

## 2020-07-23 NOTE — Addendum Note (Signed)
Addended by: Ledon Snare A on: 07/23/2020 11:11 AM   Modules accepted: Orders

## 2020-07-23 NOTE — Telephone Encounter (Signed)
PT need a refill  pantoprazole (PROTONIX) 40 MG tablet [[id number]]  [id number] [place], [place] - [address]  [address] [place] [address]  Phone: [phone] Fax: [phone]

## 2020-09-21 ENCOUNTER — Other Ambulatory Visit: Payer: Self-pay | Admitting: Podiatry

## 2020-09-21 NOTE — Telephone Encounter (Signed)
Please advise 

## 2020-09-30 ENCOUNTER — Other Ambulatory Visit: Payer: Self-pay | Admitting: Family Medicine

## 2020-09-30 DIAGNOSIS — K219 Gastro-esophageal reflux disease without esophagitis: Secondary | ICD-10-CM

## 2020-09-30 NOTE — Telephone Encounter (Signed)
Requested Prescriptions  Pending Prescriptions Disp Refills   pantoprazole (PROTONIX) 40 MG tablet [Pharmacy Med Name: PANTOPRAZOLE SOD DR 40 MG TAB] 90 tablet 0    Sig: TAKE 1 TABLET BY MOUTH EVERY DAY     Gastroenterology: Proton Pump Inhibitors Passed - 09/30/2020  3:47 PM      Passed - Valid encounter within last 12 months    Recent Outpatient Visits          6 months ago Other specified hypothyroidism   [facility] Maple Hudson., MD   1 year ago Annual physical exam   [facility] Maple Hudson., MD   3 years ago Annual physical exam   [facility] Maple Hudson., MD   4 years ago Gastroesophageal reflux disease, esophagitis presence not specified   [facility] Maple Hudson., MD   4 years ago Gastroesophageal reflux disease, esophagitis presence not specified   [facility] Maple Hudson., MD      Future Appointments            In 5 months Maple Hudson., MD [facility]

## 2020-12-29 ENCOUNTER — Other Ambulatory Visit: Payer: Self-pay | Admitting: Family Medicine

## 2020-12-29 DIAGNOSIS — K219 Gastro-esophageal reflux disease without esophagitis: Secondary | ICD-10-CM

## 2021-03-21 ENCOUNTER — Encounter: Payer: Self-pay | Admitting: Family Medicine

## 2021-04-18 ENCOUNTER — Ambulatory Visit: Payer: Managed Care, Other (non HMO) | Admitting: Physician Assistant

## 2021-04-18 VITALS — BP 136/89 | HR 60 | Temp 98.0°F | Resp 16 | Wt 184.0 lb

## 2021-04-18 DIAGNOSIS — Z008 Encounter for other general examination: Secondary | ICD-10-CM | POA: Diagnosis not present

## 2021-04-18 DIAGNOSIS — Z0000 Encounter for general adult medical examination without abnormal findings: Secondary | ICD-10-CM | POA: Diagnosis not present

## 2021-04-18 NOTE — Progress Notes (Signed)
Subjective:    Patient ID: Eileen Hooper, female    DOB: 1971-04-11, 50 y.o.   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 [employer]  Job can be stressful because of recurrent bad things happening Particularly upsetting to deal with cases involving children  Generally feels well overall Has been trying to lose weight and frustrated with lack of progress She opted to stop her Synthroid some time ago .Marland Kitchen Has routine annual planned with PCP in a few months  Review reveals coffee with heavy cream and sugar Her Dinner choice deep fried cheese stix dunked in [place] dressing as a better choice than an entree' - discussed  Has had GERD for years- tried to take a Protonix break and returned to it quickly- elimination diet review encouraged- recognizes "red things"itrritate but loves beets and eats apples- closer evaluation indicates spaghetti sauce as irritant.  "Has never had ketchup in her life "  Asked about walking/exercise program and can remember 2 brief walks in the past few weeks.  Has opted NOT to have flu shots for lifetime;  Has NOT had Covid vaccines  Review of Systems GERD   Non smoker Objective:   Physical Exam Vitals and nursing note reviewed.  Constitutional:      General: She is not in acute distress.    Appearance: Normal appearance.     Comments: Overweight , BMI 29  HENT:     Head: Normocephalic and atraumatic.     Right Ear: Tympanic membrane, ear canal and external ear normal. There is no impacted cerumen.     Left Ear: Tympanic membrane, ear canal and external ear normal. There is no impacted cerumen.     Ears:     Comments: Q-tips daily    Nose: Nose normal.     Mouth/Throat:     Mouth: Mucous membranes are moist.     Comments: DDS q 6 m  , very good condition Eyes:     Extraocular Movements: Extraocular movements intact.     Conjunctiva/sclera: Conjunctivae normal.  Cardiovascular:     Rate and Rhythm: Normal rate and regular  rhythm.     Pulses: Normal pulses.     Heart sounds: Normal heart sounds.  Pulmonary:     Effort: Pulmonary effort is normal.     Breath sounds: Normal breath sounds.  Abdominal:     Palpations: Abdomen is soft.     Tenderness: There is no abdominal tenderness. There is no guarding.  Genitourinary:    Comments: Exam deferred  Mirena- will check expiration date- follow up with Gyn Musculoskeletal:        General: Normal range of motion.     Cervical back: Normal range of motion and neck supple. No tenderness.     Comments: Socks tracks present-  1+ edema mid-shin 9 am  Discussed   Lymphadenopathy:     Cervical: No cervical adenopathy.  Skin:    General: Skin is warm and dry.     Capillary Refill: Capillary refill takes less than 2 seconds.  Neurological:     General: No focal deficit present.     Mental Status: She is alert.     Cranial Nerves: No cranial nerve deficit.     Gait: Gait normal.     Deep Tendon Reflexes: Reflexes normal.  Psychiatric:        Mood and Affect: Mood normal.       Assessment & Plan:  Biometrics and  general exam BP elevation Overweight, BMI 29  Encourage learning curve dietary choices DASH diet for serving sizes and salt restriction Daily 30 minute brisk walk goal- " if a fork touches your lips, your sneakers need to hit the sidewalk"  Encourage her to contact PCP office and request thyroid screen-lab visit Possibly  address in interim before annual exam scheduled w/ PCP in a few months Needs BP followed as well; weight check perhaps while at office for labs  Informational handouts and BMI sheet given- 6 small meals without increasing calories- watch fat grams, salt  Labs to be reported as available Patient reports MyChart active

## 2021-04-18 NOTE — Patient Instructions (Signed)
Low-Sodium Eating Plan Sodium, which is an element that makes up salt, helps you maintain a healthy balance of fluids in your body. Too much sodium can increase your blood pressure and cause fluid and waste to be held in your body. Your health care provider or dietitian may recommend following this plan if you have high blood pressure (hypertension), kidney disease, liver disease, or heart failure. Eating less sodium can help lower your blood pressure, reduce swelling, and protect your heart, liver, and kidneys. What are tips for following this plan? Reading food labels  The Nutrition Facts label lists the amount of sodium in one serving of the food. If you eat more than one serving, you must multiply the listed amount of sodium by the number of servings.  Choose foods with less than 140 mg of sodium per serving.  Avoid foods with 300 mg of sodium or more per serving. Shopping  Look for lower-sodium products, often labeled as "low-sodium" or "no salt added."  Always check the sodium content, even if foods are labeled as "unsalted" or "no salt added."  Buy fresh foods. ? Avoid canned foods and pre-made or frozen meals. ? Avoid canned, cured, or processed meats.  Buy breads that have less than 80 mg of sodium per slice.   Cooking  Eat more home-cooked food and less restaurant, buffet, and fast food.  Avoid adding salt when cooking. Use salt-free seasonings or herbs instead of table salt or sea salt. Check with your health care provider or pharmacist before using salt substitutes.  Cook with plant-based oils, such as canola, sunflower, or olive oil.   Meal planning  When eating at a restaurant, ask that your food be prepared with less salt or no salt, if possible. Avoid dishes labeled as brined, pickled, cured, smoked, or made with soy sauce, miso, or teriyaki sauce.  Avoid foods that contain MSG (monosodium glutamate). MSG is sometimes added to Chinese food, bouillon, and some canned  foods.  Make meals that can be grilled, baked, poached, roasted, or steamed. These are generally made with less sodium. General information Most people on this plan should limit their sodium intake to 1,500-2,000 mg (milligrams) of sodium each day. What foods should I eat? Fruits Fresh, frozen, or canned fruit. Fruit juice. Vegetables Fresh or frozen vegetables. "No salt added" canned vegetables. "No salt added" tomato sauce and paste. Low-sodium or reduced-sodium tomato and vegetable juice. Grains Low-sodium cereals, including oats, puffed wheat and rice, and shredded wheat. Low-sodium crackers. Unsalted rice. Unsalted pasta. Low-sodium bread. Whole-grain breads and whole-grain pasta. Meats and other proteins Fresh or frozen (no salt added) meat, poultry, seafood, and fish. Low-sodium canned tuna and salmon. Unsalted nuts. Dried peas, beans, and lentils without added salt. Unsalted canned beans. Eggs. Unsalted nut butters. Dairy Milk. Soy milk. Cheese that is naturally low in sodium, such as ricotta cheese, fresh mozzarella, or Swiss cheese. Low-sodium or reduced-sodium cheese. Cream cheese. Yogurt. Seasonings and condiments Fresh and dried herbs and spices. Salt-free seasonings. Low-sodium mustard and ketchup. Sodium-free salad dressing. Sodium-free light mayonnaise. Fresh or refrigerated horseradish. Lemon juice. Vinegar. Other foods Homemade, reduced-sodium, or low-sodium soups. Unsalted popcorn and pretzels. Low-salt or salt-free chips. The items listed above may not be a complete list of foods and beverages you can eat. Contact a dietitian for more information. What foods should I avoid? Vegetables Sauerkraut, pickled vegetables, and relishes. Olives. French fries. Onion rings. Regular canned vegetables (not low-sodium or reduced-sodium). Regular canned tomato sauce and paste (not low-sodium   or reduced-sodium). Regular tomato and vegetable juice (not low-sodium or reduced-sodium). Frozen  vegetables in sauces. Grains Instant hot cereals. Bread stuffing, pancake, and biscuit mixes. Croutons. Seasoned rice or pasta mixes. Noodle soup cups. Boxed or frozen macaroni and cheese. Regular salted crackers. Self-rising flour. Meats and other proteins Meat or fish that is salted, canned, smoked, spiced, or pickled. Precooked or cured meat, such as sausages or meat loaves. Eileen Hooper. Ham. Pepperoni. Hot dogs. Corned beef. Chipped beef. Salt pork. Jerky. Pickled herring. Anchovies and sardines. Regular canned tuna. Salted nuts. Dairy Processed cheese and cheese spreads. Hard cheeses. Cheese curds. Blue cheese. Feta cheese. String cheese. Regular cottage cheese. Buttermilk. Canned milk. Fats and oils Salted butter. Regular margarine. Ghee. Bacon fat. Seasonings and condiments Onion salt, garlic salt, seasoned salt, table salt, and sea salt. Canned and packaged gravies. Worcestershire sauce. Tartar sauce. Barbecue sauce. Teriyaki sauce. Soy sauce, including reduced-sodium. Steak sauce. Fish sauce. Oyster sauce. Cocktail sauce. Horseradish that you find on the shelf. Regular ketchup and mustard. Meat flavorings and tenderizers. Bouillon cubes. Hot sauce. Pre-made or packaged marinades. Pre-made or packaged taco seasonings. Relishes. Regular salad dressings. Salsa. Other foods Salted popcorn and pretzels. Corn chips and puffs. Potato and tortilla chips. Canned or dried soups. Pizza. Frozen entrees and pot pies. The items listed above may not be a complete list of foods and beverages you should avoid. Contact a dietitian for more information. Summary  Eating less sodium can help lower your blood pressure, reduce swelling, and protect your heart, liver, and kidneys.  Most people on this plan should limit their sodium intake to 1,500-2,000 mg (milligrams) of sodium each day.  Canned, boxed, and frozen foods are high in sodium. Restaurant foods, fast foods, and pizza are also very high in sodium. You  also get sodium by adding salt to food.  Try to cook at home, eat more fresh fruits and vegetables, and eat less fast food and canned, processed, or prepared foods. This information is not intended to replace advice given to you by your health care provider. Make sure you discuss any questions you have with your health care provider. Document Revised: 12/23/2019 Document Reviewed: 10/19/2019 Elsevier Patient Education  2021 Elsevier Inc. https://www.mata.com/.pdf">  DASH Eating Plan DASH stands for Dietary Approaches to Stop Hypertension. The DASH eating plan is a healthy eating plan that has been shown to:  Reduce high blood pressure (hypertension).  Reduce your risk for type 2 diabetes, heart disease, and stroke.  Help with weight loss. What are tips for following this plan? Reading food labels  Check food labels for the amount of salt (sodium) per serving. Choose foods with less than 5 percent of the Daily Value of sodium. Generally, foods with less than 300 milligrams (mg) of sodium per serving fit into this eating plan.  To find whole grains, look for the word "whole" as the first word in the ingredient list. Shopping  Buy products labeled as "low-sodium" or "no salt added."  Buy fresh foods. Avoid canned foods and pre-made or frozen meals. Cooking  Avoid adding salt when cooking. Use salt-free seasonings or herbs instead of table salt or sea salt. Check with your health care provider or pharmacist before using salt substitutes.  Do not fry foods. Cook foods using healthy methods such as baking, boiling, grilling, roasting, and broiling instead.  Cook with heart-healthy oils, such as olive, canola, avocado, soybean, or sunflower oil. Meal planning  Eat a balanced diet that includes: ? 4 or more servings  of fruits and 4 or more servings of vegetables each day. Try to fill one-half of your plate with fruits and vegetables. ? 6-8 servings  of whole grains each day. ? Less than 6 oz (170 g) of lean meat, poultry, or fish each day. A 3-oz (85-g) serving of meat is about the same size as a deck of cards. One egg equals 1 oz (28 g). ? 2-3 servings of low-fat dairy each day. One serving is 1 cup (237 mL). ? 1 serving of nuts, seeds, or beans 5 times each week. ? 2-3 servings of heart-healthy fats. Healthy fats called omega-3 fatty acids are found in foods such as walnuts, flaxseeds, fortified milks, and eggs. These fats are also found in cold-water fish, such as sardines, salmon, and mackerel.  Limit how much you eat of: ? Canned or prepackaged foods. ? Food that is high in trans fat, such as some fried foods. ? Food that is high in saturated fat, such as fatty meat. ? Desserts and other sweets, sugary drinks, and other foods with added sugar. ? Full-fat dairy products.  Do not salt foods before eating.  Do not eat more than 4 egg yolks a week.  Try to eat at least 2 vegetarian meals a week.  Eat more home-cooked food and less restaurant, buffet, and fast food.   Lifestyle  When eating at a restaurant, ask that your food be prepared with less salt or no salt, if possible.  If you drink alcohol: ? Limit how much you use to:  0-1 drink a day for women who are not pregnant.  0-2 drinks a day for men. ? Be aware of how much alcohol is in your drink. In the [place] one drink equals one 12 oz bottle of beer (355 mL), one 5 oz glass of wine (148 mL), or one 1 oz glass of hard liquor (44 mL). General information  Avoid eating more than 2,300 mg of salt a day. If you have hypertension, you may need to reduce your sodium intake to 1,500 mg a day.  Work with your health care provider to maintain a healthy body weight or to lose weight. Ask what an ideal weight is for you.  Get at least 30 minutes of exercise that causes your heart to beat faster (aerobic exercise) most days of the week. Activities may include walking, swimming, or  biking.  Work with your health care provider or dietitian to adjust your eating plan to your individual calorie needs. What foods should I eat? Fruits All fresh, dried, or frozen fruit. Canned fruit in natural juice (without added sugar). Vegetables Fresh or frozen vegetables (raw, steamed, roasted, or grilled). Low-sodium or reduced-sodium tomato and vegetable juice. Low-sodium or reduced-sodium tomato sauce and tomato paste. Low-sodium or reduced-sodium canned vegetables. Grains Whole-grain or whole-wheat bread. Whole-grain or whole-wheat pasta. Brown rice. Eileen Hooper. Bulgur. Whole-grain and low-sodium cereals. Pita bread. Low-fat, low-sodium crackers. Whole-wheat flour tortillas. Meats and other proteins Skinless chicken or Eileen Hooper. Ground chicken or Eileen Hooper. Pork with fat trimmed off. Fish and seafood. Egg whites. Dried beans, peas, or lentils. Unsalted nuts, nut butters, and seeds. Unsalted canned beans. Lean cuts of beef with fat trimmed off. Low-sodium, lean precooked or cured meat, such as sausages or meat loaves. Dairy Low-fat (1%) or fat-free (skim) milk. Reduced-fat, low-fat, or fat-free cheeses. Nonfat, low-sodium ricotta or cottage cheese. Low-fat or nonfat yogurt. Low-fat, low-sodium cheese. Fats and oils Soft margarine without trans fats. Vegetable oil. Reduced-fat, low-fat, or light  mayonnaise and salad dressings (reduced-sodium). Canola, safflower, olive, avocado, soybean, and sunflower oils. Avocado. Seasonings and condiments Herbs. Spices. Seasoning mixes without salt. Other foods Unsalted popcorn and pretzels. Fat-free sweets. The items listed above may not be a complete list of foods and beverages you can eat. Contact a dietitian for more information. What foods should I avoid? Fruits Canned fruit in a light or heavy syrup. Fried fruit. Fruit in cream or butter sauce. Vegetables Creamed or fried vegetables. Vegetables in a cheese sauce. Regular canned vegetables (not  low-sodium or reduced-sodium). Regular canned tomato sauce and paste (not low-sodium or reduced-sodium). Regular tomato and vegetable juice (not low-sodium or reduced-sodium). Eileen Hooper. Olives. Grains Baked goods made with fat, such as croissants, muffins, or some breads. Dry pasta or rice meal packs. Meats and other proteins Fatty cuts of meat. Ribs. Fried meat. Eileen Hooper. Bologna, salami, and other precooked or cured meats, such as sausages or meat loaves. Fat from the back of a pig (fatback). Bratwurst. Salted nuts and seeds. Canned beans with added salt. Canned or smoked fish. Whole eggs or egg yolks. Chicken or Eileen Hooper with skin. Dairy Whole or 2% milk, cream, and half-and-half. Whole or full-fat cream cheese. Whole-fat or sweetened yogurt. Full-fat cheese. Nondairy creamers. Whipped toppings. Processed cheese and cheese spreads. Fats and oils Butter. Stick margarine. Lard. Shortening. Ghee. Bacon fat. Tropical oils, such as coconut, palm kernel, or palm oil. Seasonings and condiments Onion salt, garlic salt, seasoned salt, table salt, and sea salt. Worcestershire sauce. Tartar sauce. Barbecue sauce. Teriyaki sauce. Soy sauce, including reduced-sodium. Steak sauce. Canned and packaged gravies. Fish sauce. Oyster sauce. Cocktail sauce. Store-bought horseradish. Ketchup. Mustard. Meat flavorings and tenderizers. Bouillon cubes. Hot sauces. Pre-made or packaged marinades. Pre-made or packaged taco seasonings. Relishes. Regular salad dressings. Other foods Salted popcorn and pretzels. The items listed above may not be a complete list of foods and beverages you should avoid. Contact a dietitian for more information. Where to find more information  National Heart, Lung, and Blood Institute: PopSteam.is  American Heart Association: www.heart.org  Academy of Nutrition and Dietetics: www.eatright.org  National Kidney Foundation: www.kidney.org Summary  The DASH eating plan is a healthy eating  plan that has been shown to reduce high blood pressure (hypertension). It may also reduce your risk for type 2 diabetes, heart disease, and stroke.  When on the DASH eating plan, aim to eat more fresh fruits and vegetables, whole grains, lean proteins, low-fat dairy, and heart-healthy fats.  With the DASH eating plan, you should limit salt (sodium) intake to 2,300 mg a day. If you have hypertension, you may need to reduce your sodium intake to 1,500 mg a day.  Work with your health care provider or dietitian to adjust your eating plan to your individual calorie needs. This information is not intended to replace advice given to you by your health care provider. Make sure you discuss any questions you have with your health care provider. Document Revised: 10/21/2019 Document Reviewed: 10/21/2019 Elsevier Patient Education  2021 [employer].

## 2021-04-19 LAB — LIPID PANEL
Chol/HDL Ratio: 5.1 ratio — ABNORMAL HIGH (ref 0.0–4.4)
Cholesterol, Total: 213 mg/dL — ABNORMAL HIGH (ref 100–199)
HDL: 42 mg/dL (ref >39)
LDL Chol Calc (NIH): 148 mg/dL — ABNORMAL HIGH (ref 0–99)
Triglycerides: 126 mg/dL (ref 0–149)
VLDL Cholesterol Cal: 23 mg/dL (ref 5–40)

## 2021-04-19 LAB — GLUCOSE, RANDOM: Glucose: 102 mg/dL — ABNORMAL HIGH (ref 65–99)

## 2021-07-01 ENCOUNTER — Encounter: Payer: Self-pay | Admitting: Family Medicine

## 2021-07-25 ENCOUNTER — Other Ambulatory Visit: Payer: Self-pay

## 2021-07-25 ENCOUNTER — Ambulatory Visit (INDEPENDENT_AMBULATORY_CARE_PROVIDER_SITE_OTHER): Payer: Managed Care, Other (non HMO) | Admitting: Family Medicine

## 2021-07-25 NOTE — Progress Notes (Signed)
Pt. Left without being seen

## 2021-07-25 NOTE — Progress Notes (Deleted)
Complete physical exam   Patient: Eileen Hooper   DOB: 1971/02/06   50 y.o. Female  MRN: [id number] Visit Date: 07/25/2021  Today's healthcare provider: Megan Mans, MD   No chief complaint on file.  Subjective    Eileen Hooper is a 50 y.o. female who presents today for a complete physical exam.  She reports consuming a {diet types:17450} diet. {Exercise:19826} She generally feels {well/fairly well/poorly:18703}. She reports sleeping {well/fairly well/poorly:18703}. She {does/does not:200015} have additional problems to discuss today.  HPI  ***  Past Medical History:  Diagnosis Date  . GERD (gastroesophageal reflux disease)   . Thyroid disease    Past Surgical History:  Procedure Laterality Date  . NO PAST SURGERIES     Social History   Socioeconomic History  . Marital status: Divorced    Spouse name: Not on file  . Number of children: Not on file  . Years of education: Not on file  . Highest education level: Not on file  Occupational History  . Not on file  Tobacco Use  . Smoking status: Never  . Smokeless tobacco: Never  Vaping Use  . Vaping Use: Never used  Substance and Sexual Activity  . Alcohol use: Yes    Alcohol/week: 0.0 standard drinks    Comment: socially  . Drug use: No  . Sexual activity: Yes    Birth control/protection: None    Comment: Mirena inserted 04/16/2017  Other Topics Concern  . Not on file  Social History Narrative  . Not on file   Social Determinants of Health   Financial Resource Strain: Not on file  Food Insecurity: Not on file  Transportation Needs: Not on file  Physical Activity: Not on file  Stress: Not on file  Social Connections: Not on file  Intimate Partner Violence: Not on file   Family Status  Relation Name Status  . Mother  Deceased  . Father  Alive  . Sister  Deceased       car accident   Family History  Problem Relation Age of Onset  . Cancer Mother        lung  . Heart disease Father     No Known Allergies  Patient Care Team: Maple Hudson., MD as PCP - General (Family Medicine)   Medications: Outpatient Medications Prior to Visit  Medication Sig  . indomethacin (INDOCIN) 50 MG capsule Take 1 capsule (50 mg total) by mouth 2 (two) times daily with a meal. (Patient not taking: Reported on 04/18/2021)  . levonorgestrel (MIRENA) 20 MCG/24HR IUD 1 each by Intrauterine route once.  Marland Kitchen levothyroxine (SYNTHROID) 50 MCG tablet Take 50 mcg by mouth daily before breakfast. (Patient not taking: Reported on 04/18/2021)  . methylPREDNISolone (MEDROL DOSEPAK) 4 MG TBPK tablet 6 day dose pack - take as directed (Patient not taking: No sig reported)  . pantoprazole (PROTONIX) 40 MG tablet TAKE 1 TABLET BY MOUTH EVERY DAY (Patient not taking: Reported on 04/18/2021)   No facility-administered medications prior to visit.    Review of Systems  All other systems reviewed and are negative.  {Labs  Heme  Chem  Endocrine  Serology  Results Review (optional):23779}  Objective    There were no vitals taken for this visit. {Show previous vital signs (optional):23777}  Physical Exam  ***  Last depression screening scores PHQ 2/9 Scores 03/21/2020 02/16/2017 05/07/2016  PHQ - 2 Score 0 2 0  PHQ- 9 Score 0 3 -  Last fall risk screening Fall Risk  03/21/2020  Falls in the past year? 0  Number falls in past yr: 0  Injury with Fall? 0  Follow up Falls evaluation completed   Last Audit-C alcohol use screening Alcohol Use Disorder Test (AUDIT) 03/21/2020  1. How often do you have a drink containing alcohol? 2  2. How many drinks containing alcohol do you have on a typical day when you are drinking? 0  3. How often do you have six or more drinks on one occasion? 0  AUDIT-C Score 2   A score of 3 or more in women, and 4 or more in men indicates increased risk for alcohol abuse, EXCEPT if all of the points are from question 1   No results found for any visits on 07/25/21.   Assessment & Plan    Routine Health Maintenance and Physical Exam  Exercise Activities and Dietary recommendations  Goals   None      There is no immunization history on file for this patient.  Health Maintenance  Topic Date Due  . COVID-19 Vaccine (1) Never done  . HIV Screening  Never done  . Hepatitis C Screening  Never done  . TETANUS/TDAP  Never done  . COLONOSCOPY (Pts 45-60yrs Insurance coverage will need to be confirmed)  Never done  . INFLUENZA VACCINE  07/01/2021  . PAP SMEAR-Modifier  05/16/2022  . Pneumococcal Vaccine 35-65 Years old  Aged Out  . HPV VACCINES  Aged Out    Discussed health benefits of physical activity, and encouraged her to engage in regular exercise appropriate for her age and condition.  ***  No follow-ups on file.     {provider attestation***:1}   Megan Mans, MD  [facility] [phone] (phone) [phone] (fax)  [facility]

## 2021-12-13 ENCOUNTER — Other Ambulatory Visit: Payer: Self-pay | Admitting: Family Medicine

## 2021-12-13 DIAGNOSIS — K219 Gastro-esophageal reflux disease without esophagitis: Secondary | ICD-10-CM

## 2021-12-13 NOTE — Telephone Encounter (Signed)
Requested medication (s) are due for refill today: yes  Requested medication (s) are on the active medication list: yes  Last refill:  09/15/21  Future visit scheduled: no, last seen 03/2020, no upcoming visit  Notes to clinic:  Failed protocol due to no valid visit within 12  months, please assess.  Requested Prescriptions  Pending Prescriptions Disp Refills   pantoprazole (PROTONIX) 40 MG tablet [Pharmacy Med Name: PANTOPRAZOLE SOD DR 40 MG TAB] 90 tablet 3    Sig: TAKE 1 TABLET BY MOUTH EVERY DAY     Gastroenterology: Proton Pump Inhibitors Failed - 12/13/2021  1:24 AM      Failed - Valid encounter within last 12 months    Recent Outpatient Visits           4 months ago Erroneous encounter - disregard   [facility] Jerrol Banana., MD   1 year ago Other specified hypothyroidism   [facility] Jerrol Banana., MD   2 years ago Annual physical exam   [facility] Jerrol Banana., MD   4 years ago Annual physical exam   [facility] Jerrol Banana., MD   5 years ago Gastroesophageal reflux disease, esophagitis presence not specified   [facility] Jerrol Banana., MD

## 2022-01-22 NOTE — Patient Instructions (Signed)
Breast Self-Awareness Breast self-awareness means being familiar with how your breasts look and feel. It involves checking your breasts regularly and reporting any changes to your health care provider. Practicing breast self-awareness is important. Sometimes changes may not be harmful (are benign), but sometimes a change in your breasts can be a sign of a serious medical problem. It is important to learn how to do this procedure correctly so that you can catch problems early, when treatment is more likely to be successful. All women should practice breast self-awareness, including women who have had breast implants. What you need: A mirror. A well-lit room. How to do a breast self-exam A breast self-exam is one way to learn what is normal for your breasts and whether your breasts are changing. To do a breast self-exam: Look for changes  Remove all the clothing above your waist. Stand in front of a mirror in a room with good lighting. Put your hands on your hips. Push your hands firmly downward. Compare your breasts in the mirror. Look for differences between them (asymmetry), such as: Differences in shape. Differences in size. Puckers, dips, and bumps in one breast and not the other. Look at each breast for changes in the skin, such as: Redness. Scaly areas. Look for changes in your nipples, such as: Discharge. Bleeding. Dimpling. Redness. A change in position. Feel for changes Carefully feel your breasts for lumps and changes. It is best to do this while lying on your back on the floor, and again while sitting or standing in the tub or shower with soapy water on your skin. Feel each breast in the following way: Place the arm on the side of the breast you are examining above your head. Feel your breast with the other hand. Start in the nipple area and make -inch (2 cm) overlapping circles to feel your breast. Use the pads of your three middle fingers to do this. Apply light pressure,  then medium pressure, then firm pressure. The light pressure will allow you to feel the tissue closest to the skin. The medium pressure will allow you to feel the tissue that is a little deeper. The firm pressure will allow you to feel the tissue close to the ribs. Continue the overlapping circles, moving downward over the breast until you feel your ribs below your breast. Move one finger-width toward the center of the body. Continue to use the -inch (2 cm) overlapping circles to feel your breast as you move slowly up toward your collarbone. Continue the up-and-down exam using all three pressures until you reach your armpit.  Write down what you find Writing down what you find can help you remember what to discuss with your health care provider. Write down: What is normal for each breast. Any changes that you find in each breast, including: The kind of changes you find. Any pain or tenderness. Size and location of any lumps. Where you are in your menstrual cycle, if you are still menstruating. General tips and recommendations Examine your breasts every month. If you are breastfeeding, the best time to examine your breasts is after a feeding or after using a breast pump. If you menstruate, the best time to examine your breasts is 5-7 days after your period. Breasts are generally lumpier during menstrual periods, and it may be more difficult to notice changes. With time and practice, you will become more familiar with the variations in your breasts and more comfortable with the exam. Contact a health care provider if you:  See a change in the shape or size of your breasts or nipples. See a change in the skin of your breast or nipples, such as a reddened or scaly area. Have unusual discharge from your nipples. Find a lump or thick area that was not there before. Have pain in your breasts. Have any concerns related to your breast health. Summary Breast self-awareness includes looking for  physical changes in your breasts, as well as feeling for any changes within your breasts. Breast self-awareness should be performed in front of a mirror in a well-lit room. You should examine your breasts every month. If you menstruate, the best time to examine your breasts is 5-7 days after your menstrual period. Let your health care provider know of any changes you notice in your breasts, including changes in size, changes on the skin, pain or tenderness, or unusual fluid from your nipples. This information is not intended to replace advice given to you by your health care provider. Make sure you discuss any questions you have with your health care provider. Document Revised: 07/06/2018 Document Reviewed: 07/06/2018 Elsevier Patient Education  2022 Elsevier Inc. Preventive Care 44-91 Years Old, Female Preventive care refers to lifestyle choices and visits with your health care provider that can promote health and wellness. Preventive care visits are also called wellness exams. What can I expect for my preventive care visit? Counseling Your health care provider may ask you questions about your: Medical history, including: Past medical problems. Family medical history. Pregnancy history. Current health, including: Menstrual cycle. Method of birth control. Emotional well-being. Home life and relationship well-being. Sexual activity and sexual health. Lifestyle, including: Alcohol, nicotine or tobacco, and drug use. Access to firearms. Diet, exercise, and sleep habits. Work and work Statistician. Sunscreen use. Safety issues such as seatbelt and bike helmet use. Physical exam Your health care provider will check your: Height and weight. These may be used to calculate your BMI (body mass index). BMI is a measurement that tells if you are at a healthy weight. Waist circumference. This measures the distance around your waistline. This measurement also tells if you are at a healthy weight  and may help predict your risk of certain diseases, such as type 2 diabetes and high blood pressure. Heart rate and blood pressure. Body temperature. Skin for abnormal spots. What immunizations do I need? Vaccines are usually given at various ages, according to a schedule. Your health care provider will recommend vaccines for you based on your age, medical history, and lifestyle or other factors, such as travel or where you work. What tests do I need? Screening Your health care provider may recommend screening tests for certain conditions. This may include: Lipid and cholesterol levels. Diabetes screening. This is done by checking your blood sugar (glucose) after you have not eaten for a while (fasting). Pelvic exam and Pap test. Hepatitis B test. Hepatitis C test. HIV (human immunodeficiency virus) test. STI (sexually transmitted infection) testing, if you are at risk. Lung cancer screening. Colorectal cancer screening. Mammogram. Talk with your health care provider about when you should start having regular mammograms. This may depend on whether you have a family history of breast cancer. BRCA-related cancer screening. This may be done if you have a family history of breast, ovarian, tubal, or peritoneal cancers. Bone density scan. This is done to screen for osteoporosis. Talk with your health care provider about your test results, treatment options, and if necessary, the need for more tests. Follow these instructions at home: Eating  and drinking  Eat a diet that includes fresh fruits and vegetables, whole grains, lean protein, and low-fat dairy products. Take vitamin and mineral supplements as recommended by your health care provider. Do not drink alcohol if: Your health care provider tells you not to drink. You are pregnant, may be pregnant, or are planning to become pregnant. If you drink alcohol: Limit how much you have to 0-1 drink a day. Know how much alcohol is in your drink.  In the [place] one drink equals one 12 oz bottle of beer (355 mL), one 5 oz glass of wine (148 mL), or one 1 oz glass of hard liquor (44 mL). Lifestyle Brush your teeth every morning and night with fluoride toothpaste. Floss one time each day. Exercise for at least 30 minutes 5 or more days each week. Do not use any products that contain nicotine or tobacco. These products include cigarettes, chewing tobacco, and vaping devices, such as e-cigarettes. If you need help quitting, ask your health care provider. Do not use drugs. If you are sexually active, practice safe sex. Use a condom or other form of protection to prevent STIs. If you do not wish to become pregnant, use a form of birth control. If you plan to become pregnant, see your health care provider for a prepregnancy visit. Take aspirin only as told by your health care provider. Make sure that you understand how much to take and what form to take. Work with your health care provider to find out whether it is safe and beneficial for you to take aspirin daily. Find healthy ways to manage stress, such as: Meditation, yoga, or listening to music. Journaling. Talking to a trusted person. Spending time with friends and family. Minimize exposure to UV radiation to reduce your risk of skin cancer. Safety Always wear your seat belt while driving or riding in a vehicle. Do not drive: If you have been drinking alcohol. Do not ride with someone who has been drinking. When you are tired or distracted. While texting. If you have been using any mind-altering substances or drugs. Wear a helmet and other protective equipment during sports activities. If you have firearms in your house, make sure you follow all gun safety procedures. Seek help if you have been physically or sexually abused. What's next? Visit your health care provider once a year for an annual wellness visit. Ask your health care provider how often you should have your eyes and teeth  checked. Stay up to date on all vaccines. This information is not intended to replace advice given to you by your health care provider. Make sure you discuss any questions you have with your health care provider. Document Revised: 05/15/2021 Document Reviewed: 05/15/2021 Elsevier Patient Education  [place].

## 2022-01-22 NOTE — Progress Notes (Signed)
GYNECOLOGY CLINIC PROGRESS NOTE  Subjective:    Eileen Hooper is a 51 y.o. G42P0010 female who presents for complaints of  abdominal pain, off and on, ~ 6 months.  However since scheduling appointment has not had any further pain. Also has had some off and on spotting. Additionally having vasomotor symptoms and discomfort with intercourse.   Does have a Mirena IUD in place, inserted 2018 which is when she was last seen by GYN. Has regular check ups with her PCP.    Gynecologic History:  Contraception: IUD History of STI's: Denies Last Pap: 05/17/2019. Results were: normal.  Reports remote h/o abnormal pap smear in the past.  Last mammogram: Reports that she is up to date.     OB History  Gravida Para Term Preterm AB Living  1 0 0 0 1 0  SAB IAB Ectopic Multiple Live Births  0 0 0 0 0    # Outcome Date GA Lbr Len/2nd Weight Sex Delivery Anes PTL Lv  1 AB             Past Medical History:  Diagnosis Date   GERD (gastroesophageal reflux disease)    Thyroid disease     Past Surgical History:  Procedure Laterality Date   NO PAST SURGERIES      Family History  Problem Relation Age of Onset   Cancer Mother        lung   Heart disease Father     Social History   Socioeconomic History   Marital status: Divorced    Spouse name: Not on file   Number of children: Not on file   Years of education: Not on file   Highest education level: Not on file  Occupational History   Not on file  Tobacco Use   Smoking status: Never   Smokeless tobacco: Never  Vaping Use   Vaping Use: Never used  Substance and Sexual Activity   Alcohol use: Yes    Alcohol/week: 0.0 standard drinks    Comment: socially   Drug use: No   Sexual activity: Yes    Birth control/protection: None    Comment: Mirena inserted 04/16/2017  Other Topics Concern   Not on file  Social History Narrative   Not on file   Social Determinants of Health   Financial Resource Strain: Not on file  Food  Insecurity: Not on file  Transportation Needs: Not on file  Physical Activity: Not on file  Stress: Not on file  Social Connections: Not on file  Intimate Partner Violence: Not on file    Current Outpatient Medications on File Prior to Visit  Medication Sig Dispense Refill   pantoprazole (PROTONIX) 40 MG tablet TAKE 1 TABLET BY MOUTH EVERY DAY 90 tablet 3   indomethacin (INDOCIN) 50 MG capsule Take 1 capsule (50 mg total) by mouth 2 (two) times daily with a meal. (Patient not taking: Reported on 04/18/2021) 60 capsule 0   levonorgestrel (MIRENA) 20 MCG/24HR IUD 1 each by Intrauterine route once.     levothyroxine (SYNTHROID) 50 MCG tablet Take 50 mcg by mouth daily before breakfast. (Patient not taking: Reported on 04/18/2021)     methylPREDNISolone (MEDROL DOSEPAK) 4 MG TBPK tablet 6 day dose pack - take as directed (Patient not taking: No sig reported) 21 tablet 0   No current facility-administered medications on file prior to visit.    No Known Allergies   Review of Systems Constitutional: negative for chills, fatigue, fevers and  sweats Eyes: negative for irritation, redness and visual disturbance Ears, nose, mouth, throat, and face: negative for hearing loss, nasal congestion, snoring and tinnitus Respiratory: negative for asthma, cough, sputum Cardiovascular: negative for chest pain, dyspnea, exertional chest pressure/discomfort, irregular heart beat, palpitations and syncope Gastrointestinal: negative for abdominal pain, change in bowel habits, nausea and vomiting Genitourinary: negative for abnormal menstrual periods, but positive for vaginal spotting. No genital lesions, sexual problems and vaginal discharge, dysuria and urinary incontinence Integument/breast: negative for breast lump, breast tenderness and nipple discharge Hematologic/lymphatic: negative for bleeding and easy bruising Musculoskeletal:negative for back pain and muscle weakness Neurological: negative for  dizziness, headaches, vertigo and weakness Endocrine: negative for diabetic symptoms including polydipsia, polyuria and skin dryness Allergic/Immunologic: negative for hay fever and urticaria      Objective:   Blood pressure (!) 175/108, pulse 68, height 5\' 6"  (1.676 m), weight 190 lb 3.2 oz (86.3 kg), last menstrual period 01/21/2022.  Body mass index is 30.7 kg/m.  General appearance: alert and no distress Lungs: clear to auscultation bilaterally Heart: regular rate and rhythm, S1, S2 normal, no murmur, click, rub or gallop Abdomen: soft, non-tender; bowel sounds normal; no masses,  no organomegaly Pelvic: external genitalia normal, rectovaginal septum normal.  Vagina without discharge.  Cervix normal appearing, no lesions and no motion tenderness.  Uterus mobile, nontender, normal shape and size.  Adnexae non-palpable, nontender bilaterally.  Extremities: extremities normal, atraumatic, no cyanosis or edema Neurologic: Grossly normal    Labs:  Lab Results  Component Value Date   WBC 7.8 12/21/2019   HGB 13.3 12/21/2019   HCT 39.5 12/21/2019   MCV 90 12/21/2019   PLT 268 12/21/2019    Lab Results  Component Value Date   CREATININE 0.73 06/02/2019   BUN 11 06/02/2019   NA 136 06/02/2019   K 4.3 06/02/2019   CL 98 06/02/2019   CO2 23 06/02/2019    Lab Results  Component Value Date   ALT 24 06/02/2019   AST 19 06/02/2019   GGT 18 02/02/2018   ALKPHOS 44 06/02/2019   BILITOT 0.3 06/02/2019    Lab Results  Component Value Date   TSH 3.930 06/02/2019     Assessment:   1. Breakthrough bleeding associated with intrauterine device (IUD)   2. Abdominal bloating   3. Perimenopausal vasomotor symptoms      Plan:  Blood tests: Labs by PCP.  Mammogram up to date per patient.  Contraception: IUD. Discussion had on option of replacing IUD early (although has been approved for 7 years), vs addition of supplemental hormones due to complaints of vasomotor symptoms).   Patient notes that she would like to have IUD changed, however is fearful, due to pain and anxiety with last IUD insertion. Advised that she can have pre-procedural sedation (dose of Xanax or Valium pre-procedure)  and cervical local anesthetic. Patient willing to undergo procedure with aforementioned sedation/anesthesia. Will schedule when ready.   Follow up when IUD replacement desired.    08/03/2019, MD [facility]

## 2022-01-24 ENCOUNTER — Encounter: Payer: Self-pay | Admitting: Obstetrics and Gynecology

## 2022-01-24 ENCOUNTER — Other Ambulatory Visit: Payer: Self-pay

## 2022-01-24 ENCOUNTER — Ambulatory Visit: Payer: Managed Care, Other (non HMO) | Admitting: Obstetrics and Gynecology

## 2022-01-24 VITALS — BP 175/108 | HR 68 | Ht 66.0 in | Wt 190.2 lb

## 2022-01-24 DIAGNOSIS — N951 Menopausal and female climacteric states: Secondary | ICD-10-CM

## 2022-01-24 DIAGNOSIS — R140 Abdominal distension (gaseous): Secondary | ICD-10-CM

## 2022-01-24 DIAGNOSIS — N921 Excessive and frequent menstruation with irregular cycle: Secondary | ICD-10-CM | POA: Diagnosis not present

## 2022-01-24 DIAGNOSIS — Z975 Presence of (intrauterine) contraceptive device: Secondary | ICD-10-CM

## 2022-01-24 DIAGNOSIS — Z124 Encounter for screening for malignant neoplasm of cervix: Secondary | ICD-10-CM

## 2022-01-24 DIAGNOSIS — Z01419 Encounter for gynecological examination (general) (routine) without abnormal findings: Secondary | ICD-10-CM

## 2022-01-24 DIAGNOSIS — Z1231 Encounter for screening mammogram for malignant neoplasm of breast: Secondary | ICD-10-CM

## 2022-08-20 ENCOUNTER — Other Ambulatory Visit: Payer: Self-pay

## 2022-08-20 ENCOUNTER — Other Ambulatory Visit: Payer: Self-pay | Admitting: Emergency Medicine

## 2022-08-20 ENCOUNTER — Ambulatory Visit (INDEPENDENT_AMBULATORY_CARE_PROVIDER_SITE_OTHER): Payer: Managed Care, Other (non HMO)

## 2022-08-20 ENCOUNTER — Ambulatory Visit: Payer: Managed Care, Other (non HMO)

## 2022-08-20 ENCOUNTER — Ambulatory Visit: Payer: Managed Care, Other (non HMO) | Admitting: Podiatry

## 2022-08-20 DIAGNOSIS — M778 Other enthesopathies, not elsewhere classified: Secondary | ICD-10-CM

## 2022-08-20 DIAGNOSIS — M2042 Other hammer toe(s) (acquired), left foot: Secondary | ICD-10-CM | POA: Diagnosis not present

## 2022-08-20 NOTE — Progress Notes (Signed)
She presents today chief concern of pain to the second metatarsophalangeal joint of the left foot which was present for quite some time thinking that it could possibly even be gout she states that now it has completely resolved.  Denies any trauma states that was sort of a dull achy pain.  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  She has some mild tenderness on palpation of the dorsal medial aspect of the second metatarsal phalangeal joint.  Radiographs taken today demonstrate only skew foot no abnormality at the level of the second metatarsophalangeal joint or of the second toe itself other than some lateral deviation of the toe.  Assessment: Most likely resolved capsulitis second metatarsophalangeal joint left pes planovalgus left foot.  Plan: Follow-up with [place] on an as-needed basis.

## 2022-12-08 ENCOUNTER — Other Ambulatory Visit: Payer: Self-pay | Admitting: Physician Assistant

## 2022-12-08 DIAGNOSIS — K219 Gastro-esophageal reflux disease without esophagitis: Secondary | ICD-10-CM

## 2022-12-08 NOTE — Telephone Encounter (Signed)
Requested medications are due for refill today.  yes  Requested medications are on the active medications list.  yes  Last refill. 09/10/2022 #90 0 rf  Future visit scheduled.   no  Notes to clinic.  Dr. Rosanna Randy listed as PCP. PT more than 3 months overdue for OV.    Requested Prescriptions  Pending Prescriptions Disp Refills   pantoprazole (PROTONIX) 40 MG tablet [Pharmacy Med Name: PANTOPRAZOLE SOD DR 40 MG TAB] 90 tablet 3    Sig: TAKE 1 TABLET BY MOUTH EVERY DAY     Gastroenterology: Proton Pump Inhibitors Failed - 12/08/2022  1:22 AM      Failed - Valid encounter within last 12 months    Recent Outpatient Visits           1 year ago Erroneous encounter - disregard   [facility] Jerrol Banana., MD   2 years ago Other specified hypothyroidism   [facility] Jerrol Banana., MD   3 years ago Annual physical exam   [facility] Jerrol Banana., MD   5 years ago Annual physical exam   [facility] Jerrol Banana., MD   6 years ago Gastroesophageal reflux disease, esophagitis presence not specified   [facility] Jerrol Banana., MD

## 2022-12-23 ENCOUNTER — Telehealth: Payer: Self-pay | Admitting: Obstetrics and Gynecology

## 2022-12-23 ENCOUNTER — Other Ambulatory Visit: Payer: Self-pay | Admitting: Obstetrics and Gynecology

## 2022-12-23 MED ORDER — ALPRAZOLAM 0.5 MG PO TABS: 0.5000 mg | ORAL_TABLET | Freq: Once | ORAL | 0 refills | Status: AC

## 2022-12-23 NOTE — Telephone Encounter (Signed)
Please inform that prescription has been sent.

## 2022-12-23 NOTE — Telephone Encounter (Signed)
Patient is coming in to have her Mirena IUD removal and new one reinserted. Patient would like to have some Xanax or Valium called in prior to appointment. Patient has really bad anxiety. She spoke with Dr Marcelline Mates about it last year at her appointment. Patient would like meds called into the [facility] [address]   CB# [phone]

## 2022-12-23 NOTE — Telephone Encounter (Signed)
Patient coming in on 01/21/23 at 1:30 with Dr Marcelline Mates for Mirena removal and reinsertion.

## 2022-12-26 NOTE — Telephone Encounter (Signed)
Noted. Will order to arrive by appointment date/time. 

## 2023-01-20 NOTE — Progress Notes (Unsigned)
    GYNECOLOGY OFFICE PROCEDURE NOTE  Eileen Hooper is a 52 y.o. G1P0010 here for Mirena IUD removal and reinsertion. No GYN concerns.  Last pap smear was on 05/17/2019 and was normal.  IUD Removal and Reinsertion  Patient identified, informed consent performed, consent signed.   Discussed risks of irregular bleeding, cramping, infection, malpositioning or misplacement of the IUD outside the uterus which may require further procedures. Also discussed >99% contraception efficacy, increased risk of ectopic pregnancy with failure of method.   Emphasized that this did not protect against STIs, condoms recommended during all sexual encounters.Advised to use backup contraception for one week as the risk of pregnancy is higher during the transition period of removing an IUD and replacing it with another one. Time out was performed. Speculum placed in the vagina. The strings of the IUD were grasped and pulled using ring forceps. The IUD was successfully removed in its entirety. The cervix was cleaned with Betadine x 2 and grasped anteriorly with a single tooth tenaculum.  The new *** IUD insertion apparatus was used to sound the uterus to *** cm;  the IUD was then placed per manufacturer's recommendations. Strings trimmed to 3 cm. Tenaculum was removed, good hemostasis noted. Patient tolerated procedure well.   Patient was given post-procedure instructions.  She was reminded to have backup contraception for one week during this transition period between IUDs.  Patient was also asked to check IUD strings periodically and follow up in 4 weeks for IUD check.   Rubie Maid, MD [place]

## 2023-01-21 ENCOUNTER — Other Ambulatory Visit (HOSPITAL_COMMUNITY)
Admission: RE | Admit: 2023-01-21 | Discharge: 2023-01-21 | Disposition: A | Discharge status: Home / Self Care | Payer: Managed Care, Other (non HMO) | Source: Ambulatory Visit | Attending: Obstetrics and Gynecology | Admitting: Obstetrics and Gynecology

## 2023-01-21 ENCOUNTER — Encounter: Payer: Self-pay | Admitting: Obstetrics and Gynecology

## 2023-01-21 ENCOUNTER — Ambulatory Visit (INDEPENDENT_AMBULATORY_CARE_PROVIDER_SITE_OTHER): Payer: Managed Care, Other (non HMO) | Admitting: Obstetrics and Gynecology

## 2023-01-21 VITALS — BP 144/82 | HR 79 | Resp 16 | Ht 65.5 in | Wt 187.6 lb

## 2023-01-21 DIAGNOSIS — Z30433 Encounter for removal and reinsertion of intrauterine contraceptive device: Secondary | ICD-10-CM | POA: Diagnosis not present

## 2023-01-21 DIAGNOSIS — Z124 Encounter for screening for malignant neoplasm of cervix: Secondary | ICD-10-CM | POA: Insufficient documentation

## 2023-01-21 MED ORDER — LEVONORGESTREL 20 MCG/DAY IU IUD: 1.0000 | INTRAUTERINE_SYSTEM | Freq: Once | INTRAUTERINE | Status: AC

## 2023-01-21 NOTE — Addendum Note (Signed)
Addended by: Chilton Greathouse on: 01/21/2023 02:08 PM   Modules accepted: Orders

## 2023-01-22 ENCOUNTER — Encounter: Payer: Self-pay | Admitting: Obstetrics and Gynecology

## 2023-01-27 LAB — CYTOLOGY - PAP
Adequacy: ABSENT
Comment: NEGATIVE
Diagnosis: NEGATIVE
High risk HPV: NEGATIVE

## 2023-02-18 ENCOUNTER — Ambulatory Visit: Payer: Managed Care, Other (non HMO) | Admitting: Obstetrics and Gynecology

## 2023-02-18 ENCOUNTER — Encounter: Payer: Self-pay | Admitting: Obstetrics and Gynecology

## 2023-02-18 VITALS — BP 143/94 | HR 58 | Resp 16 | Ht 64.5 in | Wt 186.4 lb

## 2023-02-18 DIAGNOSIS — Z30431 Encounter for routine checking of intrauterine contraceptive device: Secondary | ICD-10-CM

## 2023-02-18 NOTE — Progress Notes (Signed)
     GYNECOLOGY OFFICE ENCOUNTER NOTE  History:  52 y.o. G1P0010 here today for today for IUD string check; Mirena  IUD was placed  01/21/2023. No complaints about the IUD, no concerning side effects.  The following portions of the patient's history were reviewed and updated as appropriate: allergies, current medications, past family history, past medical history, past social history, past surgical history and problem list. Last pap smear on 01/21/2023 was normal, negative HRHPV.   Review of Systems:  Pertinent items are noted in HPI.  Objective:  Physical Exam Blood pressure (!) 143/94, pulse (!) 58, resp. rate 16, height 5' 4.5" (1.638 m), weight 186 lb 6.4 oz (84.6 kg). CONSTITUTIONAL: Well-developed, well-nourished female in no acute distress.  NEUROLOGIC: Alert and oriented to person, place, and time. Normal reflexes, muscle tone coordination.  ABDOMEN: Soft, no distention noted.   PELVIC: Normal appearing external genitalia; normal appearing vaginal mucosa and cervix.  IUD strings visualized, about 2 cm in length outside cervix. Done in the presence of a chaperone.  EXTREMITIES: Non-tender, no edema or cyanosis  Assessment & Plan:  Patient to keep IUD in place for up to 8 years; can come in for removal if she desires pregnancy earlier or for any concerning side effects.    Rubie Maid, MD [facility]

## 2023-03-09 ENCOUNTER — Other Ambulatory Visit: Payer: Self-pay | Admitting: Family Medicine

## 2023-03-09 DIAGNOSIS — K219 Gastro-esophageal reflux disease without esophagitis: Secondary | ICD-10-CM

## 2023-03-13 ENCOUNTER — Ambulatory Visit (INDEPENDENT_AMBULATORY_CARE_PROVIDER_SITE_OTHER): Payer: Managed Care, Other (non HMO)

## 2023-03-13 VITALS — BP 147/90 | HR 80 | Ht 65.0 in | Wt 189.0 lb

## 2023-03-13 DIAGNOSIS — R319 Hematuria, unspecified: Secondary | ICD-10-CM | POA: Diagnosis not present

## 2023-03-13 LAB — POCT URINALYSIS DIPSTICK
Bilirubin, UA: NEGATIVE
Blood, UA: NEGATIVE
Glucose, UA: NEGATIVE
Ketones, UA: NEGATIVE
Leukocytes, UA: NEGATIVE
Nitrite, UA: NEGATIVE
Protein, UA: NEGATIVE
Spec Grav, UA: <=1.005 — AB (ref 1.010–1.025)
Urobilinogen, UA: 0.2 E.U./dL
pH, UA: 5 (ref 5.0–8.0)

## 2023-03-13 NOTE — Progress Notes (Signed)
    NURSE VISIT NOTE  Subjective:    Patient ID: Eileen Hooper, female    DOB: September 01, 1971, 52 y.o.   MRN: [id number]       HPI  Patient is a 52 y.o. G73P0010 female who presents for possible UTI symptoms. She said she had red colored urine one time, and she has some back pain cramping. Patient denies urinary frequency and burning.  Patient does not have a history of recurrent UTI.  Patient does not have a history of pyelonephritis. Patient states she has started to eat more healthy and in her diet, she is eating more fruit than usual: oranges, pineapple, grapes. Her urine before these symptoms was pretty clear, since she has started her diet, it is more yellow.   Objective:    BP (!) 147/90   Pulse 80   Ht 5\' 5"  (1.651 m)   Wt 189 lb (85.7 kg)   BMI 31.45 kg/m    Lab Review  Results for orders placed or performed in visit on 03/13/23  POCT Urinalysis Dipstick  Result Value Ref Range   Color, UA     Clarity, UA     Glucose, UA Negative Negative   Bilirubin, UA neg    Ketones, UA neg    Spec Grav, UA <=1.005 (A) 1.010 - 1.025   Blood, UA neg    pH, UA 5.0 5.0 - 8.0   Protein, UA Negative Negative   Urobilinogen, UA 0.2 0.2 or 1.0 E.U./dL   Nitrite, UA neg    Leukocytes, UA Negative Negative   Appearance     Odor      Assessment:   1. Hematuria, unspecified type      Plan:   Urine Culture Sent. Maintain adequate hydration.  May use AZO OTC prn.  Follow up if symptoms worsen or fail to improve as anticipated, and as needed.  Will wait for results to be treated if needed.   Donnetta Hail, CMA

## 2023-03-13 NOTE — Patient Instructions (Signed)
Urinary Tract Infection, Adult A urinary tract infection (UTI) is an infection of any part of the urinary tract. The urinary tract includes: The kidneys. The ureters. The bladder. The urethra. These organs make, store, and get rid of pee (urine) in the body. What are the causes? This infection is caused by germs (bacteria) in your genital area. These germs grow and cause swelling (inflammation) of your urinary tract. What increases the risk? The following factors may make you more likely to develop this condition: Using a small, thin tube (catheter) to drain pee. Not being able to control when you pee or poop (incontinence). Being female. If you are female, these things can increase the risk: Using these methods to prevent pregnancy: A medicine that kills sperm (spermicide). A device that blocks sperm (diaphragm). Having low levels of a female hormone (estrogen). Being pregnant. You are more likely to develop this condition if: You have genes that add to your risk. You are sexually active. You take antibiotic medicines. You have trouble peeing because of: A prostate that is bigger than normal, if you are female. A blockage in the part of your body that drains pee from the bladder. A kidney stone. A nerve condition that affects your bladder. Not getting enough to drink. Not peeing often enough. You have other conditions, such as: Diabetes. A weak disease-fighting system (immune system). Sickle cell disease. Gout. Injury of the spine. What are the signs or symptoms? Symptoms of this condition include: Needing to pee right away. Peeing small amounts often. Pain or burning when peeing. Blood in the pee. Pee that smells bad or not like normal. Trouble peeing. Pee that is cloudy. Fluid coming from the vagina, if you are female. Pain in the belly or lower back. Other symptoms include: Vomiting. Not feeling hungry. Feeling mixed up (confused). This may be the first symptom in  older adults. Being tired and grouchy (irritable). A fever. Watery poop (diarrhea). How is this treated? Taking antibiotic medicine. Taking other medicines. Drinking enough water. In some cases, you may need to see a specialist. Follow these instructions at home:  Medicines Take over-the-counter and prescription medicines only as told by your doctor. If you were prescribed an antibiotic medicine, take it as told by your doctor. Do not stop taking it even if you start to feel better. General instructions Make sure you: Pee until your bladder is empty. Do not hold pee for a long time. Empty your bladder after sex. Wipe from front to back after peeing or pooping if you are a female. Use each tissue one time when you wipe. Drink enough fluid to keep your pee pale yellow. Keep all follow-up visits. Contact a doctor if: You do not get better after 1-2 days. Your symptoms go away and then come back. Get help right away if: You have very bad back pain. You have very bad pain in your lower belly. You have a fever. You have chills. You feeling like you will vomit or you vomit. Summary A urinary tract infection (UTI) is an infection of any part of the urinary tract. This condition is caused by germs in your genital area. There are many risk factors for a UTI. Treatment includes antibiotic medicines. Drink enough fluid to keep your pee pale yellow. This information is not intended to replace advice given to you by your health care provider. Make sure you discuss any questions you have with your health care provider. Document Revised: 06/29/2020 Document Reviewed: 06/29/2020 Elsevier Patient Education    2023 Elsevier Inc.  

## 2023-03-17 ENCOUNTER — Telehealth: Payer: Self-pay

## 2023-03-17 LAB — URINE CULTURE

## 2023-03-17 NOTE — Telephone Encounter (Signed)
Pt states she is feeling better than she did on Friday but still feels like she has eaten a 7 course meal.  Has very minor pain  in back and has lower left abdominal pain that is very minor as well.  Pt questions if the 7 course meal feeling could be from IUD; if so she wants it out.  Pt aware that is possible; pt also aware we are waiting on final culture results.

## 2023-03-17 NOTE — Telephone Encounter (Signed)
Pt calling; calling to go over lab results from Friday.  [phone]

## 2023-03-17 NOTE — Telephone Encounter (Signed)
Most likely not from IUD. Pls keep lookout for C&S results and we can treat if positive.

## 2023-03-17 NOTE — Telephone Encounter (Signed)
Are pt's UTI sx bad? If so, will go ahead and treat without C&S. If not, pt to wait for C&S. Thx

## 2023-03-18 ENCOUNTER — Telehealth: Payer: Self-pay

## 2023-03-18 ENCOUNTER — Other Ambulatory Visit: Payer: Self-pay

## 2023-03-18 ENCOUNTER — Other Ambulatory Visit: Payer: Self-pay | Admitting: Obstetrics and Gynecology

## 2023-03-18 DIAGNOSIS — N390 Urinary tract infection, site not specified: Secondary | ICD-10-CM

## 2023-03-18 MED ORDER — CIPROFLOXACIN HCL 250 MG PO TABS: 250.0000 mg | ORAL_TABLET | Freq: Two times a day (BID) | ORAL | 0 refills | Status: AC

## 2023-03-18 MED ORDER — SULFAMETHOXAZOLE-TRIMETHOPRIM 800-160 MG PO TABS: 1.0000 | ORAL_TABLET | Freq: Two times a day (BID) | ORAL | 0 refills | Status: AC

## 2023-03-18 NOTE — Telephone Encounter (Signed)
The patient confirmed appointment for 5/9 at 1:35 pm with Dr Valentino Saxon.

## 2023-03-18 NOTE — Telephone Encounter (Signed)
Pt would like to make an appt to have her IUD removed. Pls call pt to schedule appt.

## 2023-03-19 ENCOUNTER — Encounter: Payer: Self-pay | Admitting: Obstetrics and Gynecology

## 2023-03-19 NOTE — Telephone Encounter (Signed)
ThxValentino Saxon treated already.

## 2023-03-20 ENCOUNTER — Other Ambulatory Visit: Payer: Self-pay | Admitting: Family Medicine

## 2023-03-20 ENCOUNTER — Other Ambulatory Visit: Payer: Self-pay | Admitting: Physician Assistant

## 2023-03-20 DIAGNOSIS — K219 Gastro-esophageal reflux disease without esophagitis: Secondary | ICD-10-CM

## 2023-04-09 ENCOUNTER — Ambulatory Visit: Payer: Managed Care, Other (non HMO) | Admitting: Obstetrics and Gynecology

## 2024-03-15 ENCOUNTER — Ambulatory Visit: Payer: Managed Care, Other (non HMO) | Admitting: Dermatology

## 2024-03-15 ENCOUNTER — Encounter: Payer: Self-pay | Admitting: Dermatology

## 2024-03-15 DIAGNOSIS — Z7189 Other specified counseling: Secondary | ICD-10-CM

## 2024-03-15 DIAGNOSIS — D2261 Melanocytic nevi of right upper limb, including shoulder: Secondary | ICD-10-CM | POA: Diagnosis not present

## 2024-03-15 DIAGNOSIS — L578 Other skin changes due to chronic exposure to nonionizing radiation: Secondary | ICD-10-CM | POA: Diagnosis not present

## 2024-03-15 DIAGNOSIS — D2262 Melanocytic nevi of left upper limb, including shoulder: Secondary | ICD-10-CM

## 2024-03-15 DIAGNOSIS — D229 Melanocytic nevi, unspecified: Secondary | ICD-10-CM

## 2024-03-15 DIAGNOSIS — W908XXA Exposure to other nonionizing radiation, initial encounter: Secondary | ICD-10-CM | POA: Diagnosis not present

## 2024-03-15 DIAGNOSIS — L719 Rosacea, unspecified: Secondary | ICD-10-CM

## 2024-03-15 DIAGNOSIS — D2239 Melanocytic nevi of other parts of face: Secondary | ICD-10-CM

## 2024-03-15 MED ORDER — AZELAIC ACID 15 % EX GEL: CUTANEOUS | 3 refills | Status: AC

## 2024-03-15 NOTE — Patient Instructions (Addendum)
 Start Finacea gel once or twice daily to nose and cheeks.    Recommend daily broad spectrum/Zinc sunscreen SPF 30+ to sun-exposed areas, reapply every 2 hours as needed. Call for new or changing lesions.  Staying in the shade or wearing long sleeves, sun glasses (UVA+UVB protection) and wide brim hats (4-inch brim around the entire circumference of the hat) are also recommended for sun protection.    Recommend wearing long sleeved sun shirts. Apply sunscreen prior to putting on sun shirt.    Counseling for BBL / IPL / Laser and Coordination of Care Discussed the treatment option of Broad Band Light (BBL) /Intense Pulsed Light (IPL)/ Laser for skin discoloration, including brown spots and redness.  Typically we recommend at least 1-3 treatment sessions about 5-8 weeks apart for best results.  Cannot have tanned skin when BBL performed, and regular use of sunscreen/photoprotection is advised after the procedure to help maintain results. The patient's condition may also require "maintenance treatments" in the future.  The fee for BBL / laser treatments is $350 per treatment session for the whole face.  A fee can be quoted for other parts of the body.  Insurance typically does not pay for BBL/laser treatments and therefore the fee is an out-of-pocket cost. Recommend prophylactic valtrex treatment. Once scheduled for procedure, will send Rx in prior to patient's appointment   Due to recent changes in healthcare laws, you may see results of your pathology and/or laboratory studies on MyChart before the doctors have had a chance to review them. We understand that in some cases there may be results that are confusing or concerning to you. Please understand that not all results are received at the same time and often the doctors may need to interpret multiple results in order to provide you with the best plan of care or course of treatment. Therefore, we ask that you please give [place] 2 business days to thoroughly  review all your results before contacting the office for clarification. Should we see a critical lab result, you will be contacted sooner.   If You Need Anything After Your Visit  If you have any questions or concerns for your doctor, please call our main line at [phone] and press option 4 to reach your doctor's medical assistant. If no one answers, please leave a voicemail as directed and we will return your call as soon as possible. Messages left after 4 pm will be answered the following business day.   You may also send [place] a message via MyChart. We typically respond to MyChart messages within 1-2 business days.  For prescription refills, please ask your pharmacy to contact our office. Our fax number is [phone].  If you have an urgent issue when the clinic is closed that cannot wait until the next business day, you can page your doctor at the number below.    Please note that while we do our best to be available for urgent issues outside of office hours, we are not available 24/7.   If you have an urgent issue and are unable to reach [place], you may choose to seek medical care at your doctor's office, retail clinic, urgent care center, or emergency room.  If you have a medical emergency, please immediately call 911 or go to the emergency department.  Pager Numbers  - Dr. Gwen Pounds: [phone]  - Dr. Roseanne Reno: [phone]  - Dr. Katrinka Blazing: [phone]   In the event of inclement weather, please call our main line at [phone] for an  update on the status of any delays or closures.  Dermatology Medication Tips: Please keep the boxes that topical medications come in in order to help keep track of the instructions about where and how to use these. Pharmacies typically print the medication instructions only on the boxes and not directly on the medication tubes.   If your medication is too expensive, please contact our office at [phone] option 4 or send [place] a message through  MyChart.   We are unable to tell what your co-pay for medications will be in advance as this is different depending on your insurance coverage. However, we may be able to find a substitute medication at lower cost or fill out paperwork to get insurance to cover a needed medication.   If a prior authorization is required to get your medication covered by your insurance company, please allow [place] 1-2 business days to complete this process.  Drug prices often vary depending on where the prescription is filled and some pharmacies may offer cheaper prices.  The website www.goodrx.com contains coupons for medications through different pharmacies. The prices here do not account for what the cost may be with help from insurance (it may be cheaper with your insurance), but the website can give you the price if you did not use any insurance.  - You can print the associated coupon and take it with your prescription to the pharmacy.  - You may also stop by our office during regular business hours and pick up a GoodRx coupon card.  - If you need your prescription sent electronically to a different pharmacy, notify our office through [facility] or by phone at [phone] option 4.     Si Usted Necesita Algo Despus de Su Visita  Tambin puede enviarnos un mensaje a travs de Clinical cytogeneticist. Por lo general respondemos a los mensajes de MyChart en el transcurso de 1 a 2 das hbiles.  Para renovar recetas, por favor pida a su farmacia que se ponga en contacto con nuestra oficina. Annie Sable de fax es [place] [phone].  Si tiene un asunto urgente cuando la clnica est cerrada y que no puede esperar hasta el siguiente da hbil, puede llamar/localizar a su doctor(a) al nmero que aparece a continuacin.   Por favor, tenga en cuenta que aunque hacemos todo lo posible para estar disponibles para asuntos urgentes fuera del horario de [place], no estamos disponibles las 24 horas del da, los 7 [address]  [address] de la  [place].   Si tiene un problema urgente y no puede comunicarse con nosotros, puede optar por buscar atencin mdica  en el consultorio de su doctor(a), en una clnica privada, en un centro de atencin urgente o en una sala de emergencias.  Si tiene Engineer, drilling, por favor llame inmediatamente al 911 o vaya a la sala de emergencias.  Nmeros de bper  - Dr. Gwen Pounds: [phone]  - Dra. Roseanne Reno: [phone]  - Dr. Katrinka Blazing: [phone]   En caso de inclemencias del tiempo, por favor llame a Lacy Duverney principal al [phone] para una actualizacin sobre el [place] de cualquier retraso o cierre.  Consejos para la medicacin en dermatologa: Por favor, guarde las cajas en las que vienen los medicamentos de uso tpico para ayudarle a seguir las instrucciones sobre dnde y cmo usarlos. Las farmacias generalmente imprimen las instrucciones del medicamento slo en las cajas y no directamente en los tubos del [place].   Si su medicamento es [employer], por favor, pngase en contacto con Ferne Coe oficina  llamando al [phone] y presione la opcin 4 o envenos un mensaje a travs de Clinical cytogeneticist.   No podemos decirle cul ser su copago por los medicamentos por adelantado ya que esto es diferente dependiendo de la cobertura de su seguro. Sin embargo, es posible que podamos encontrar un medicamento sustituto a Audiological scientist un formulario para que el seguro cubra el medicamento que se considera necesario.   Si se requiere una autorizacin previa para que su compaa de seguros [place] su medicamento, por favor permtanos de 1 a 2 das hbiles para completar este proceso.  Los precios de los medicamentos varan con frecuencia dependiendo del Environmental consultant de dnde se surte la receta y alguna farmacias pueden ofrecer precios ms baratos.  El sitio web www.goodrx.com tiene cupones para medicamentos de Health and safety inspector. Los precios aqu no tienen en cuenta lo que podra costar con la ayuda  del seguro (puede ser ms barato con su seguro), pero el sitio web puede darle el precio si no utiliz Tourist information centre manager.  - Puede imprimir el cupn correspondiente y llevarlo con su receta a la farmacia.  - Tambin puede pasar por nuestra oficina durante el horario de atencin regular y Education officer, museum una tarjeta de cupones de GoodRx.  - Si necesita que su receta se enve electrnicamente a una farmacia diferente, informe a nuestra oficina a travs de MyChart de [place] o por telfono llamando al [phone] y presione la opcin 4.

## 2024-03-15 NOTE — Progress Notes (Signed)
 New Patient Visit   Subjective  Eileen Hooper is a 53 y.o. female who presents for the following: discoloration on arms. Dur: >1 year. Denies itching, burning, stinging or pain. States she wears long sleeves when riding her motorcycle but the wind blows sleeves up and this area of the arms is exposed.   2 spots on nose. No change since noticed. Asymptomatic. Unsure if warts or moles.   The patient has spots, moles and lesions to be evaluated, some may be new or changing and the patient may have concern these could be cancer.   The following portions of the chart were reviewed this encounter and updated as appropriate: medications, allergies, medical history  Review of Systems:  No other skin or systemic complaints except as noted in HPI or Assessment and Plan.  Objective  Well appearing patient in no apparent distress; mood and affect are within normal limits.  A focused examination was performed of the following areas: Face, arms  Relevant exam findings are noted in the Assessment and Plan.             Assessment & Plan   ACTINIC DAMAGE - chronic, secondary to cumulative UV radiation exposure/sun exposure over time - diffuse pigmentation changes, reddish coloration and hyperpigmented macules and patches at BL forearms - Recommend daily broad spectrum sunscreen SPF 30+ to sun-exposed areas, reapply every 2 hours as needed.  - Recommend staying in the shade or wearing long sleeves, sun glasses (UVA+UVB protection) and wide brim hats (4-inch brim around the entire circumference of the hat). - Call for new or changing lesions. Recommend wearing long sleeved sun shirts. Apply sunscreen prior to putting on sun shirt.    MELANOCYTIC NEVI Exam: Tan-brown and/or pink-flesh-colored symmetric macules and papules at forearms.   Treatment Plan: Benign appearing on exam today. Recommend observation. Call clinic for new or changing moles. Recommend daily use of broad spectrum  spf 30+ sunscreen to sun-exposed areas.    Angiofibroma/Fibrous Papules - 2 mm pink tan thin papules without features suspicious for malignancy on dermoscopy at left nasal tip and left nasal alar rim - Benign-appearing.  Observation.  Call clinic for new or changing lesions.    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 Condition is symptomatic / bothersome to patient. Not to goal.   Rosacea is a chronic progressive skin condition usually affecting the face of adults, causing redness and/or acne bumps. It is treatable but not curable. It sometimes affects the eyes (ocular rosacea) as well. It may respond to topical and/or systemic medication and can flare with stress, sun exposure, alcohol, exercise, topical steroids (including hydrocortisone/cortisone 10) and some foods.  Daily application of broad spectrum spf 30+ sunscreen to face is recommended to reduce flares.  Patient denies grittiness of the eyes  Treatment Plan Start Finacea gel once or twice daily to nose and cheeks.   Counseling for BBL / IPL / Laser and Coordination of Care Discussed the treatment option of Broad Band Light (BBL) /Intense Pulsed Light (IPL)/ Laser for skin discoloration, including brown spots and redness.  Typically we recommend at least 1-3 treatment sessions about 5-8 weeks apart for best results.  Cannot have tanned skin when BBL performed, and regular use of sunscreen/photoprotection is advised after the procedure to help maintain results. The patient's condition may also require "maintenance treatments" in the future.  The fee for BBL /  laser treatments is $350 per treatment session for the whole face.  A fee can be quoted for other parts of the body.  Insurance typically does not pay for BBL/laser treatments and therefore the fee is an out-of-pocket cost. Recommend prophylactic valtrex  treatment. Once scheduled for procedure, will send Rx in prior to patient's appointment.       Return if symptoms worsen or fail to improve.  I, Jill Parcell, CMA, am acting as scribe for Artemio Larry, MD.   Documentation: I have reviewed the above documentation for accuracy and completeness, and I agree with the above.  Artemio Larry, MD

## 2024-10-26 LAB — COLOGUARD: COLOGUARD: NEGATIVE

## 2024-10-31 ENCOUNTER — Ambulatory Visit: Admitting: Dermatology

## 2024-10-31 DIAGNOSIS — D1801 Hemangioma of skin and subcutaneous tissue: Secondary | ICD-10-CM

## 2024-10-31 DIAGNOSIS — L989 Disorder of the skin and subcutaneous tissue, unspecified: Secondary | ICD-10-CM

## 2024-10-31 DIAGNOSIS — L820 Inflamed seborrheic keratosis: Secondary | ICD-10-CM

## 2024-10-31 NOTE — Progress Notes (Addendum)
   Follow-Up Visit   Subjective  Eileen Hooper is a 53 y.o. female who presents for the following: check spot lower lip, noticed ~44m ago, hx of bleeding, no changes in size, no symptoms, check spot L cheek, ~11yr, scaly, getting larger, irritating   The following portions of the chart were reviewed this encounter and updated as appropriate: medications, allergies, medical history  Review of Systems:  No other skin or systemic complaints except as noted in HPI or Assessment and Plan.  Objective  Well appearing patient in no apparent distress; mood and affect are within normal limits.   A focused examination was performed of the following areas: Face, lip  Relevant exam findings are noted in the Assessment and Plan.  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 photo today  Treatment Plan: Benign appearing, observe Start OTC Vaseline ointment prn to aa lips to prevent dryness/cracking/bleeding, sample given Discussed shave removal if irritated, pt declines today  INFLAMED SEBORRHEIC KERATOSIS L jaw x 1 Symptomatic, irritating, patient would like treated. Destruction of lesion - L jaw x 1  Destruction method: cryotherapy   Informed consent: discussed and consent obtained   Lesion destroyed using liquid nitrogen: Yes   Region frozen until ice ball extended beyond lesion: Yes   Outcome: patient tolerated procedure well with no complications   Post-procedure details: wound care instructions given   Additional details:  Prior to procedure, discussed risks of blister formation, small wound, skin dyspigmentation, or rare scar following cryotherapy. Recommend Vaseline ointment to treated areas while healing.      Return if symptoms worsen or fail to improve.  I, Grayce Saunas, RMA, am acting as scribe for Rexene Rattler, MD .   Documentation: I have reviewed the above documentation for accuracy and completeness, and I agree with the above.  Rexene Rattler, MD

## 2024-10-31 NOTE — Patient Instructions (Addendum)
